# Patient Record
Sex: Male | Born: 1965 | Race: White | Hispanic: No | Marital: Married | Smoking: Never smoker
Health system: Southern US, Community
[De-identification: ages and names within clinical notes are randomized; demographics above are authoritative.]

## PROBLEM LIST (undated history)

## (undated) DIAGNOSIS — I10 Essential (primary) hypertension: Secondary | ICD-10-CM

## (undated) DIAGNOSIS — E7800 Pure hypercholesterolemia, unspecified: Secondary | ICD-10-CM

## (undated) HISTORY — PX: HERNIA REPAIR: SHX51

---

## 2009-03-13 ENCOUNTER — Encounter (INDEPENDENT_AMBULATORY_CARE_PROVIDER_SITE_OTHER): Payer: Self-pay | Admitting: *Deleted

## 2009-03-13 ENCOUNTER — Emergency Department (HOSPITAL_COMMUNITY): Admission: EM | Admit: 2009-03-13 | Discharge: 2009-03-13 | Payer: Self-pay | Admitting: Emergency Medicine

## 2009-03-23 ENCOUNTER — Ambulatory Visit: Payer: Self-pay | Admitting: Gastroenterology

## 2009-03-23 DIAGNOSIS — K625 Hemorrhage of anus and rectum: Secondary | ICD-10-CM

## 2009-03-23 DIAGNOSIS — I10 Essential (primary) hypertension: Secondary | ICD-10-CM

## 2009-03-24 ENCOUNTER — Telehealth: Payer: Self-pay | Admitting: Gastroenterology

## 2009-04-06 ENCOUNTER — Telehealth: Payer: Self-pay | Admitting: Gastroenterology

## 2009-04-12 ENCOUNTER — Ambulatory Visit: Payer: Self-pay | Admitting: Gastroenterology

## 2011-01-31 LAB — COMPREHENSIVE METABOLIC PANEL
ALT: 24 U/L (ref 0–53)
Alkaline Phosphatase: 26 U/L — ABNORMAL LOW (ref 39–117)
BUN: 11 mg/dL (ref 6–23)
Chloride: 108 mEq/L (ref 96–112)
Glucose, Bld: 97 mg/dL (ref 70–99)
Potassium: 3.9 mEq/L (ref 3.5–5.1)
Sodium: 141 mEq/L (ref 135–145)
Total Bilirubin: 0.8 mg/dL (ref 0.3–1.2)

## 2011-01-31 LAB — DIFFERENTIAL
Basophils Absolute: 0 10*3/uL (ref 0.0–0.1)
Basophils Relative: 1 % (ref 0–1)
Eosinophils Absolute: 0.2 10*3/uL (ref 0.0–0.7)
Monocytes Absolute: 0.3 10*3/uL (ref 0.1–1.0)
Neutro Abs: 2.6 10*3/uL (ref 1.7–7.7)
Neutrophils Relative %: 50 % (ref 43–77)

## 2011-01-31 LAB — CBC
HCT: 42.9 % (ref 39.0–52.0)
Hemoglobin: 14.8 g/dL (ref 13.0–17.0)
RBC: 4.78 MIL/uL (ref 4.22–5.81)
WBC: 5 10*3/uL (ref 4.0–10.5)

## 2011-03-10 ENCOUNTER — Emergency Department (HOSPITAL_COMMUNITY)
Admission: EM | Admit: 2011-03-10 | Discharge: 2011-03-10 | Disposition: A | Discharge status: Home / Self Care | Payer: 59 | Attending: Emergency Medicine | Admitting: Emergency Medicine

## 2011-03-10 ENCOUNTER — Emergency Department (HOSPITAL_COMMUNITY): Payer: 59

## 2011-03-10 DIAGNOSIS — Y9269 Other specified industrial and construction area as the place of occurrence of the external cause: Secondary | ICD-10-CM | POA: Insufficient documentation

## 2011-03-10 DIAGNOSIS — W11XXXA Fall on and from ladder, initial encounter: Secondary | ICD-10-CM | POA: Insufficient documentation

## 2011-03-10 DIAGNOSIS — Y990 Civilian activity done for income or pay: Secondary | ICD-10-CM | POA: Insufficient documentation

## 2011-03-10 DIAGNOSIS — M25569 Pain in unspecified knee: Secondary | ICD-10-CM | POA: Insufficient documentation

## 2011-03-10 DIAGNOSIS — I10 Essential (primary) hypertension: Secondary | ICD-10-CM | POA: Insufficient documentation

## 2011-03-10 DIAGNOSIS — IMO0002 Reserved for concepts with insufficient information to code with codable children: Secondary | ICD-10-CM | POA: Insufficient documentation

## 2011-03-10 DIAGNOSIS — E7800 Pure hypercholesterolemia, unspecified: Secondary | ICD-10-CM | POA: Insufficient documentation

## 2017-04-14 ENCOUNTER — Observation Stay (HOSPITAL_BASED_OUTPATIENT_CLINIC_OR_DEPARTMENT_OTHER)
Admission: EM | Admit: 2017-04-14 | Discharge: 2017-04-16 | Disposition: A | Discharge status: Home / Self Care | Payer: 59 | Attending: Internal Medicine | Admitting: Internal Medicine

## 2017-04-14 ENCOUNTER — Encounter (HOSPITAL_BASED_OUTPATIENT_CLINIC_OR_DEPARTMENT_OTHER): Payer: Self-pay | Admitting: Emergency Medicine

## 2017-04-14 ENCOUNTER — Observation Stay (HOSPITAL_COMMUNITY): Payer: 59

## 2017-04-14 DIAGNOSIS — I499 Cardiac arrhythmia, unspecified: Secondary | ICD-10-CM

## 2017-04-14 DIAGNOSIS — R0789 Other chest pain: Secondary | ICD-10-CM

## 2017-04-14 DIAGNOSIS — I4891 Unspecified atrial fibrillation: Secondary | ICD-10-CM | POA: Diagnosis not present

## 2017-04-14 DIAGNOSIS — E782 Mixed hyperlipidemia: Secondary | ICD-10-CM | POA: Insufficient documentation

## 2017-04-14 DIAGNOSIS — E785 Hyperlipidemia, unspecified: Secondary | ICD-10-CM | POA: Diagnosis present

## 2017-04-14 DIAGNOSIS — I493 Ventricular premature depolarization: Secondary | ICD-10-CM | POA: Diagnosis not present

## 2017-04-14 DIAGNOSIS — Z882 Allergy status to sulfonamides status: Secondary | ICD-10-CM | POA: Insufficient documentation

## 2017-04-14 DIAGNOSIS — R002 Palpitations: Secondary | ICD-10-CM | POA: Diagnosis present

## 2017-04-14 DIAGNOSIS — I498 Other specified cardiac arrhythmias: Secondary | ICD-10-CM | POA: Diagnosis present

## 2017-04-14 DIAGNOSIS — I10 Essential (primary) hypertension: Secondary | ICD-10-CM | POA: Diagnosis present

## 2017-04-14 DIAGNOSIS — Z79899 Other long term (current) drug therapy: Secondary | ICD-10-CM | POA: Insufficient documentation

## 2017-04-14 HISTORY — DX: Pure hypercholesterolemia, unspecified: E78.00

## 2017-04-14 HISTORY — DX: Essential (primary) hypertension: I10

## 2017-04-14 LAB — COMPREHENSIVE METABOLIC PANEL
ALT: 29 U/L (ref 17–63)
AST: 21 U/L (ref 15–41)
Albumin: 4.9 g/dL (ref 3.5–5.0)
Alkaline Phosphatase: 36 U/L — ABNORMAL LOW (ref 38–126)
Anion gap: 10 (ref 5–15)
BUN: 18 mg/dL (ref 6–20)
CHLORIDE: 106 mmol/L (ref 101–111)
CO2: 24 mmol/L (ref 22–32)
CREATININE: 1.11 mg/dL (ref 0.61–1.24)
Calcium: 9.9 mg/dL (ref 8.9–10.3)
Glucose, Bld: 114 mg/dL — ABNORMAL HIGH (ref 65–99)
POTASSIUM: 4 mmol/L (ref 3.5–5.1)
Sodium: 140 mmol/L (ref 135–145)
Total Bilirubin: 0.7 mg/dL (ref 0.3–1.2)
Total Protein: 8.4 g/dL — ABNORMAL HIGH (ref 6.5–8.1)

## 2017-04-14 LAB — CBC WITH DIFFERENTIAL/PLATELET
BASOS PCT: 1 %
Basophils Absolute: 0 10*3/uL (ref 0.0–0.1)
Eosinophils Absolute: 0.1 10*3/uL (ref 0.0–0.7)
Eosinophils Relative: 1 %
HCT: 49 % (ref 39.0–52.0)
Hemoglobin: 17.3 g/dL — ABNORMAL HIGH (ref 13.0–17.0)
Lymphocytes Relative: 34 %
Lymphs Abs: 1.8 10*3/uL (ref 0.7–4.0)
MCH: 30.6 pg (ref 26.0–34.0)
MCHC: 35.3 g/dL (ref 30.0–36.0)
MCV: 86.7 fL (ref 78.0–100.0)
MONO ABS: 0.5 10*3/uL (ref 0.1–1.0)
MONOS PCT: 9 %
NEUTROS PCT: 55 %
Neutro Abs: 2.8 10*3/uL (ref 1.7–7.7)
Platelets: 201 10*3/uL (ref 150–400)
RBC: 5.65 MIL/uL (ref 4.22–5.81)
RDW: 13.8 % (ref 11.5–15.5)
WBC: 5.1 10*3/uL (ref 4.0–10.5)

## 2017-04-14 LAB — TROPONIN I: Troponin I: <0.03 ng/mL (ref <0.03)

## 2017-04-14 LAB — MRSA PCR SCREENING: MRSA by PCR: NEGATIVE

## 2017-04-14 LAB — MAGNESIUM: MAGNESIUM: 1.9 mg/dL (ref 1.7–2.4)

## 2017-04-14 LAB — TSH
TSH: 1.245 u[IU]/mL (ref 0.350–4.500)
TSH: 2.516 u[IU]/mL (ref 0.350–4.500)

## 2017-04-14 MED ORDER — ASPIRIN 81 MG PO CHEW
324.0000 mg | CHEWABLE_TABLET | Freq: Once | ORAL | Status: AC
  Administered 2017-04-14: 324 mg via ORAL
  Filled 2017-04-14: qty 4

## 2017-04-14 MED ORDER — IPRATROPIUM-ALBUTEROL 0.5-2.5 (3) MG/3ML IN SOLN: 3.0000 mL | RESPIRATORY_TRACT | Status: DC | PRN

## 2017-04-14 MED ORDER — SODIUM CHLORIDE 0.9 % IV BOLUS (SEPSIS)
1000.0000 mL | Freq: Once | INTRAVENOUS | Status: AC
  Administered 2017-04-14: 1000 mL via INTRAVENOUS

## 2017-04-14 MED ORDER — ASPIRIN EC 81 MG PO TBEC
81.0000 mg | DELAYED_RELEASE_TABLET | Freq: Every day | ORAL | Status: DC
  Administered 2017-04-15 – 2017-04-16 (×2): 81 mg via ORAL
  Filled 2017-04-14 (×2): qty 1

## 2017-04-14 MED ORDER — ONDANSETRON HCL 4 MG PO TABS: 4.0000 mg | ORAL_TABLET | Freq: Four times a day (QID) | ORAL | Status: DC | PRN

## 2017-04-14 MED ORDER — ENOXAPARIN SODIUM 40 MG/0.4ML ~~LOC~~ SOLN
40.0000 mg | SUBCUTANEOUS | Status: DC
  Administered 2017-04-14 – 2017-04-15 (×2): 40 mg via SUBCUTANEOUS
  Filled 2017-04-14 (×2): qty 0.4

## 2017-04-14 MED ORDER — HYDRALAZINE HCL 20 MG/ML IJ SOLN: 10.0000 mg | INTRAMUSCULAR | Status: DC | PRN

## 2017-04-14 MED ORDER — DILTIAZEM HCL 30 MG PO TABS
30.0000 mg | ORAL_TABLET | Freq: Once | ORAL | Status: AC
  Administered 2017-04-14: 30 mg via ORAL
  Filled 2017-04-14: qty 1

## 2017-04-14 MED ORDER — ONDANSETRON HCL 4 MG/2ML IJ SOLN
4.0000 mg | Freq: Four times a day (QID) | INTRAMUSCULAR | Status: DC | PRN
  Administered 2017-04-15: 4 mg via INTRAVENOUS
  Filled 2017-04-14: qty 2

## 2017-04-14 MED ORDER — DILTIAZEM LOAD VIA INFUSION
15.0000 mg | Freq: Once | INTRAVENOUS | Status: AC
  Administered 2017-04-14: 15 mg via INTRAVENOUS
  Filled 2017-04-14: qty 15

## 2017-04-14 MED ORDER — ACETAMINOPHEN 325 MG PO TABS: 650.0000 mg | ORAL_TABLET | Freq: Four times a day (QID) | ORAL | Status: DC | PRN

## 2017-04-14 MED ORDER — ASPIRIN EC 81 MG PO TBEC: 81.0000 mg | DELAYED_RELEASE_TABLET | Freq: Every day | ORAL | Status: DC

## 2017-04-14 MED ORDER — DILTIAZEM HCL 100 MG IV SOLR
5.0000 mg/h | INTRAVENOUS | Status: DC
  Administered 2017-04-14 (×2): 10 mg/h via INTRAVENOUS
  Filled 2017-04-14 (×2): qty 100

## 2017-04-14 NOTE — H&P (Signed)
History and Physical    Theodore Frazier ZOX:[id number] DOB: Mar 08, 1966 DOA: 04/14/2017  Referring MD/NP/PA: Dr. Gershon Crane Ripudeep PCP: Patient, No Pcp Per  Patient coming from:  [facility] transfer  Chief Complaint: Heart fluttering  HPI: Theodore Frazier is a 51 y.o. male with medical history significant of HTN , HLD, and heart murmur; who presents with complaints of acute onset of feeling his heart fluttering today. Last night the patient reports that he drank 12-15  12 ounce beers last night with a friend. He normally does not do this and drinks 1-2 beers during the week. When he woke up this morning he felt empty stomach. He ate something and started to feel slightly better, but palpitations reoccurred. Patient notes that after feeling slightly lightheaded, but denies loss of consciousness. Associated symptoms included left arm numbness for the last 4 days, approximately 12lb weight loss with diet changes, and discomfort with lying down take goes away with standing. He has known that he has an irregular heartbeat/heart murmur for many years, but never diagnosed with atrial fibrillation. Patient has a made a similar episode in the past but not this severe. Denies any shortness of breath, nausea, vomiting, chest pain, change of vision, focal weakness, or leg swellings. He was previously on lisinopril 10 mg for his hypertension, but this was discontinued as his blood pressures have been doing well up until here recently. At home systolic blood pressures have been running in the 140s over the last week. Lastly patient notes that he was told that he had elevated cholesterol levels, but his PCP left and it was never followed back up on.  ED Course: On admission into [facility] patient was seen to be afebrile, pulse up to 151, respirations up to 21, blood pressure elevated to 170/103, and O2 saturation maintained on room air. Initial labs were relatively unremarkable. Patient was started on a diltiazem drip with improvement of heart  rate, and admitted to a stepdown bed.  Review of Systems: As per HPI otherwise 10 point review of systems negative.   Past Medical History:  Diagnosis Date  . High cholesterol   . Hypertension     Past Surgical History:  Procedure Laterality Date  . HERNIA REPAIR       reports that he has never smoked. He has never used smokeless tobacco. He reports that he drinks alcohol. He reports that he does not use drugs.  Allergies  Allergen Reactions  . Sulfonamide Derivatives     History reviewed. No pertinent family history.  Prior to Admission medications   Not on File    Physical Exam:  Constitutional:Middle-aged male in NAD, calm, comfortable Vitals:   04/14/17 1930 04/14/17 1945 04/14/17 2056 04/14/17 2058  BP: 125/84  (!) 170/103 (!) 170/103  Pulse: (!) 40 75 73   Resp: 18 15    Temp:   98.5 F (36.9 C) 98.5 F (36.9 C)  TempSrc:   Oral Oral  SpO2: 95% 97% 98% 99%  Weight:   91.3 kg (201 lb 4.8 oz)   Height:   5\' 9"  (1.753 m)    Eyes: PERRL, lids and conjunctivae normal ENMT: Mucous membranes are moist. Posterior pharynx clear of any exudate or lesions.Normal dentition.  Neck: normal, supple, no masses, no thyromegaly Respiratory: Crackles appreciated in the mid to lower lung fields. Normal respiratory effort. No accessory muscle use.  Cardiovascular: Irregular irregular, possible murmur appreciated, no rubs / gallops. No extremity edema. 2+ pedal pulses. No carotid bruits.  Abdomen: no tenderness,  no masses palpated. No hepatosplenomegaly. Bowel sounds positive.  Musculoskeletal: no clubbing / cyanosis. No joint deformity upper and lower extremities. Good ROM, no contractures. Normal muscle tone.  Skin: no rashes, lesions, ulcers. No induration Neurologic: CN 2-12 grossly intact. Sensation intact, DTR normal. Strength 5/5 in all 4.  Psychiatric: Normal judgment and insight. Alert and oriented x 3. Normal mood.     Labs on Admission: I have personally reviewed  following labs and imaging studies  CBC:  Recent Labs Lab 04/14/17 1351  WBC 5.1  NEUTROABS 2.8  HGB 17.3*  HCT 49.0  MCV 86.7  PLT 201   Basic Metabolic Panel:  Recent Labs Lab 04/14/17 1351  NA 140  K 4.0  CL 106  CO2 24  GLUCOSE 114*  BUN 18  CREATININE 1.11  CALCIUM 9.9   GFR: Estimated Creatinine Clearance: 89.8 mL/min (by C-G formula based on SCr of 1.11 mg/dL). Liver Function Tests:  Recent Labs Lab 04/14/17 1351  AST 21  ALT 29  ALKPHOS 36*  BILITOT 0.7  PROT 8.4*  ALBUMIN 4.9   No results for input(s): LIPASE, AMYLASE in the last 168 hours. No results for input(s): AMMONIA in the last 168 hours. Coagulation Profile: No results for input(s): INR, PROTIME in the last 168 hours. Cardiac Enzymes:  Recent Labs Lab 04/14/17 1351  TROPONINI <0.03   BNP (last 3 results) No results for input(s): PROBNP in the last 8760 hours. HbA1C: No results for input(s): HGBA1C in the last 72 hours. CBG: No results for input(s): GLUCAP in the last 168 hours. Lipid Profile: No results for input(s): CHOL, HDL, LDLCALC, TRIG, CHOLHDL, LDLDIRECT in the last 72 hours. Thyroid Function Tests: No results for input(s): TSH, T4TOTAL, FREET4, T3FREE, THYROIDAB in the last 72 hours. Anemia Panel: No results for input(s): VITAMINB12, FOLATE, FERRITIN, TIBC, IRON, RETICCTPCT in the last 72 hours. Urine analysis: No results found for: COLORURINE, APPEARANCEUR, LABSPEC, PHURINE, GLUCOSEU, HGBUR, BILIRUBINUR, KETONESUR, PROTEINUR, UROBILINOGEN, NITRITE, LEUKOCYTESUR Sepsis Labs: No results found for this or any previous visit (from the past 240 hour(s)).   Radiological Exams on Admission: No results found.  EKG: Independently reviewed. A. fib with RVR at 132 beats per minute  Assessment/Plan Atrial fibrillation with RVR (HCC) : Acute. Patient presents for fluttering found to be in A. fib with RVR. chadsvasc=1 - Admit to stepdown - Atrial fibrillation orders are  initiated - Check TSH and magnesium - Monitor electrolytes and correct as needed - Aspirin - Check echocardiogram - May warrant formal consult to cardiology in a.m.  Essential hypertension: Patient with previous history of hypertension on lisinopril 10 mg, but had been able to be taken off of BP medications. During hospitalization patient's blood pressures noted to be a size 170/103 - Hydralazine IV prn - Patient may likely need to be placed back on lisinopril  Left arm numbness - Trend cardiac enzymes  Hyperlipidemia - Check lipid panel in a.m.    Alcohol use: Question if excessive alcohol use possibly lead to acute onset of symptoms  DVT prophylaxis: Lovenox Code Status: full Family Communication: Discuss plan of care with patient family present at bedside  Disposition Plan: Discharge home possibly in a.m.  Consults called: none Admission status: Observation  Clydie Braunondell A Smith MD [facility] Pager [phone]- [phone]  If 7PM-7AM, please contact night-coverage [email or web address] Password [email or web address]  04/14/2017, 9:08 PM

## 2017-04-14 NOTE — ED Triage Notes (Addendum)
Intermittent palpitations since this morning. Pt reports drinking more alcohol last night than normal.

## 2017-04-14 NOTE — Plan of Care (Signed)
51yo male with hx of Afib, HTN, was drinking alcohol last night, today having palpitations.  EKG with rapid Afib. HR in 130's   Started on cardizem drip at med ctr.   Will need step down bed     Jenavi Beedle M.D. [facility] 04/14/2017, 4:11 PM  Pager: [phone]

## 2017-04-14 NOTE — ED Provider Notes (Signed)
[facility] Provider Note   CSN: [id number] Arrival date & time: 04/14/17  1336     History   Chief Complaint Chief Complaint  Patient presents with  . Palpitations    HPI Tanuj Pricilla Holmucker is a 51 y.o. male.  HPI   Last night drank about 12 beers.  Woke up feeling like stomach empty. Ate and drank some gatorade but then palpitations started. Had pepsi today.  Palpitations starting 1PM, then felt lightheaded. Reports for years has had intermittent palpitations. 5 years ago had palpitations.  Irregular heart beat for 17 years, reports providers would listen to his heart and note irregular beat but he never had an ECG.  He had palpitations similar to today 5 yrs ago but reports he has always been told he has an irregular heart beat. No shortness of breath, no nausea/vomiting, no chest pain. Recently taken off blood pressure medications.     Past Medical History:  Diagnosis Date  . High cholesterol   . Hypertension     Patient Active Problem List   Diagnosis Date Noted  . Atrial fibrillation with RVR (HCC) 04/14/2017  . Atrial fibrillation (HCC) 04/14/2017  . ESSENTIAL HYPERTENSION, BENIGN 03/23/2009  . HEMORRHAGE OF RECTUM AND ANUS 03/23/2009    Past Surgical History:  Procedure Laterality Date  . HERNIA REPAIR         Home Medications    Prior to Admission medications   Not on File    Family History No family history on file.  Social History Social History  Substance Use Topics  . Smoking status: Never Smoker  . Smokeless tobacco: Never Used  . Alcohol use Yes     Comment: occ     Allergies   Sulfonamide derivatives   Review of Systems Review of Systems  Constitutional: Negative for fever.  HENT: Negative for sore throat.   Eyes: Negative for visual disturbance.  Respiratory: Negative for shortness of breath.   Cardiovascular: Positive for palpitations. Negative for chest pain and leg swelling.  Gastrointestinal: Negative for abdominal  pain.  Genitourinary: Negative for difficulty urinating.  Musculoskeletal: Negative for back pain and neck stiffness.  Skin: Negative for rash.  Neurological: Positive for light-headedness. Negative for syncope and headaches.     Physical Exam Updated Vital Signs BP (!) 148/78   Pulse 79   Temp 98.1 F (36.7 C) (Oral)   Resp 18   Ht 5\' 9"  (1.753 m)   Wt 95.3 kg (210 lb)   SpO2 97%   BMI 31.01 kg/m   Physical Exam  Constitutional: He is oriented to person, place, and time. He appears well-developed and well-nourished. No distress.  HENT:  Head: Normocephalic and atraumatic.  Eyes: Conjunctivae and EOM are normal.  Neck: Normal range of motion.  Cardiovascular: Normal heart sounds and intact distal pulses.  An irregularly irregular rhythm present. Tachycardia present.  Exam reveals no gallop and no friction rub.   No murmur heard. Pulmonary/Chest: Effort normal and breath sounds normal. No respiratory distress. He has no wheezes. He has no rales.  Abdominal: Soft. He exhibits no distension. There is no tenderness. There is no guarding.  Musculoskeletal: He exhibits no edema.  Neurological: He is alert and oriented to person, place, and time.  Skin: Skin is warm and dry. He is not diaphoretic.  Nursing note and vitals reviewed.    ED Treatments / Results  Labs (all labs ordered are listed, but only abnormal results are displayed) Labs Reviewed  CBC WITH  DIFFERENTIAL/PLATELET - Abnormal; Notable for the following:       Result Value   Hemoglobin 17.3 (*)    All other components within normal limits  COMPREHENSIVE METABOLIC PANEL - Abnormal; Notable for the following:    Glucose, Bld 114 (*)    Total Protein 8.4 (*)    Alkaline Phosphatase 36 (*)    All other components within normal limits  TROPONIN I  TSH    EKG  EKG Interpretation  Date/Time:  Saturday April 14 2017 13:47:26 EDT Ventricular Rate:  132 PR Interval:    QRS Duration: 94 QT Interval:  316 QTC  Calculation: 468 R Axis:   51 Text Interpretation:  Atrial fibrillation with rapid ventricular response Septal infarct , age undetermined Marked ST abnormality, possible inferior subendocardial injury Abnormal ECG No previous ECGs available Confirmed by Alvira Monday ([id number]) on 04/14/2017 1:56:56 PM       Radiology No results found.  Procedures Procedures (including critical care time)  Medications Ordered in ED Medications  diltiazem (CARDIZEM) 1 mg/mL load via infusion 15 mg (15 mg Intravenous Bolus from Bag 04/14/17 1459)    And  diltiazem (CARDIZEM) 100 mg in dextrose 5 % 100 mL (1 mg/mL) infusion (5 mg/hr Intravenous Rate/Dose Change 04/14/17 1500)  sodium chloride 0.9 % bolus 1,000 mL (0 mLs Intravenous Stopped 04/14/17 1510)  diltiazem (CARDIZEM) tablet 30 mg (30 mg Oral Given 04/14/17 1451)   CRITICAL CARE:afib RVR  Performed by: Rhae Lerner   Total critical care time: 30 minutes  Critical care time was exclusive of separately billable procedures and treating other patients.  Critical care was necessary to treat or prevent imminent or life-threatening deterioration.  Critical care was time spent personally by me on the following activities: development of treatment plan with patient and/or surrogate as well as nursing, discussions with consultants, evaluation of patient's response to treatment, examination of patient, obtaining history from patient or surrogate, ordering and performing treatments and interventions, ordering and review of laboratory studies, ordering and review of radiographic studies, pulse oximetry and re-evaluation of patient's condition.   Initial Impression / Assessment and Plan / ED Course  I have reviewed the triage vital signs and the nursing notes.  Pertinent labs & imaging results that were available during my care of the patient were reviewed by me and considered in my medical decision making (see chart for details).       51 year old male with a history of hypertension hyperlipidemia presents with concern for palpitations. Reports the palpitations and today, however he has a history of "irregular heartbeat" that has been noted by other providers without a diagnosis. His EKG shows atrial fibrillation with RVR with a rate in the 140s. He is otherwise hemodynamically stable, denies chest pain or shortness of breath.  He was given a dose of oral diltiazem, given bolus and started on a drip, with continuing atrial fibrillation with a rate in the 120s with adult drip on 5.  We will titrate drip, admit [facility] hospitalist service at [facility] for further care. Suspect holiday heart given history of alcohol use last night. Patient was given normal saline for rehydration. However, given his hx of irregular heart beat I do not feel he is a candidate for cardioversion.  ChadsVasc 1.  Pt updated of plan of care.  Final Clinical Impressions(s) / ED Diagnoses   Final diagnoses:  Atrial fibrillation with rapid ventricular response (HCC)    New Prescriptions New Prescriptions   No medications on file  Alvira Monday, MD 04/14/17 [phone]

## 2017-04-14 NOTE — ED Notes (Signed)
ED Provider at bedside. 

## 2017-04-15 ENCOUNTER — Observation Stay (HOSPITAL_BASED_OUTPATIENT_CLINIC_OR_DEPARTMENT_OTHER): Payer: 59

## 2017-04-15 DIAGNOSIS — I10 Essential (primary) hypertension: Secondary | ICD-10-CM | POA: Diagnosis not present

## 2017-04-15 DIAGNOSIS — F320 Major depressive disorder, single episode, mild: Secondary | ICD-10-CM | POA: Diagnosis not present

## 2017-04-15 DIAGNOSIS — I4891 Unspecified atrial fibrillation: Secondary | ICD-10-CM

## 2017-04-15 DIAGNOSIS — I493 Ventricular premature depolarization: Secondary | ICD-10-CM | POA: Diagnosis not present

## 2017-04-15 DIAGNOSIS — E782 Mixed hyperlipidemia: Secondary | ICD-10-CM | POA: Diagnosis not present

## 2017-04-15 DIAGNOSIS — E784 Other hyperlipidemia: Secondary | ICD-10-CM | POA: Diagnosis not present

## 2017-04-15 DIAGNOSIS — E785 Hyperlipidemia, unspecified: Secondary | ICD-10-CM | POA: Diagnosis present

## 2017-04-15 LAB — BASIC METABOLIC PANEL
Anion gap: 7 (ref 5–15)
BUN: 14 mg/dL (ref 6–20)
CO2: 25 mmol/L (ref 22–32)
CREATININE: 1.04 mg/dL (ref 0.61–1.24)
Calcium: 9.3 mg/dL (ref 8.9–10.3)
Chloride: 108 mmol/L (ref 101–111)
GFR calc Af Amer: >60 mL/min (ref >60)
GFR calc non Af Amer: >60 mL/min (ref >60)
GLUCOSE: 115 mg/dL — AB (ref 65–99)
POTASSIUM: 3.9 mmol/L (ref 3.5–5.1)
Sodium: 140 mmol/L (ref 135–145)

## 2017-04-15 LAB — LIPID PANEL
CHOLESTEROL: 276 mg/dL — AB (ref 0–200)
HDL: 35 mg/dL — ABNORMAL LOW (ref >40)
LDL Cholesterol: 176 mg/dL — ABNORMAL HIGH (ref 0–99)
Total CHOL/HDL Ratio: 7.9 RATIO
Triglycerides: 323 mg/dL — ABNORMAL HIGH (ref <150)
VLDL: 65 mg/dL — ABNORMAL HIGH (ref 0–40)

## 2017-04-15 LAB — CBC
HCT: 46.1 % (ref 39.0–52.0)
Hemoglobin: 15.7 g/dL (ref 13.0–17.0)
MCH: 30.1 pg (ref 26.0–34.0)
MCHC: 34.1 g/dL (ref 30.0–36.0)
MCV: 88.3 fL (ref 78.0–100.0)
PLATELETS: 198 10*3/uL (ref 150–400)
RBC: 5.22 MIL/uL (ref 4.22–5.81)
RDW: 13.6 % (ref 11.5–15.5)
WBC: 6.3 10*3/uL (ref 4.0–10.5)

## 2017-04-15 LAB — ECHOCARDIOGRAM COMPLETE
AVLVOTPG: 3 mmHg
EERAT: 4.58
FS: 25 % — AB (ref 28–44)
Height: 69 in
IVS/LV PW RATIO, ED: 1.03
LA diam index: 2.08 cm/m2
LA vol A4C: 41 ml
LA vol: 44.1 mL
LASIZE: 43 mm
LAVOLIN: 21.3 mL/m2
LEFT ATRIUM END SYS DIAM: 43 mm
LV PW d: 11.4 mm — AB (ref 0.6–1.1)
LV TDI E'MEDIAL: 8.05
LV e' LATERAL: 10.2 cm/s
LVEEAVG: 4.58
LVEEMED: 4.58
LVOT VTI: 16 cm
LVOT area: 4.52 cm2
LVOT peak vel: 90.7 cm/s
LVOTD: 24 mm
LVOTSV: 72 mL
Lateral S' vel: 16.1 cm/s
MV pk E vel: 46.7 m/s
MVPKAVEL: 65.6 m/s
TAPSE: 21.2 mm
TDI e' lateral: 10.2
Weight: 3224.01 oz

## 2017-04-15 LAB — TROPONIN I
Troponin I: <0.03 ng/mL (ref <0.03)
Troponin I: <0.03 ng/mL (ref <0.03)

## 2017-04-15 LAB — HIV ANTIBODY (ROUTINE TESTING W REFLEX): HIV Screen 4th Generation wRfx: NONREACTIVE

## 2017-04-15 MED ORDER — ATORVASTATIN CALCIUM 20 MG PO TABS
20.0000 mg | ORAL_TABLET | Freq: Every day | ORAL | Status: DC
  Administered 2017-04-15: 20 mg via ORAL
  Filled 2017-04-15: qty 1

## 2017-04-15 MED ORDER — DILTIAZEM HCL 30 MG PO TABS
30.0000 mg | ORAL_TABLET | Freq: Four times a day (QID) | ORAL | Status: DC
  Administered 2017-04-15 – 2017-04-16 (×6): 30 mg via ORAL
  Filled 2017-04-15 (×6): qty 1

## 2017-04-15 MED ORDER — LISINOPRIL 10 MG PO TABS
10.0000 mg | ORAL_TABLET | Freq: Every day | ORAL | Status: DC
  Administered 2017-04-15 – 2017-04-16 (×2): 10 mg via ORAL
  Filled 2017-04-15 (×2): qty 1

## 2017-04-15 MED ORDER — OFF THE BEAT BOOK
Freq: Once | Status: AC
  Administered 2017-04-15: 07:00:00
  Filled 2017-04-15: qty 1

## 2017-04-15 NOTE — Progress Notes (Signed)
  Echocardiogram 2D Echocardiogram has been performed.  Theodore Frazier, Theodore Frazier 04/15/2017, 4:01 PM

## 2017-04-15 NOTE — Progress Notes (Signed)
PROGRESS NOTE    Theodore Frazier  RUE:[id number] DOB: 06-Jun-1966 DOA: 04/14/2017 PCP: Patient, No Pcp Per   Brief Narrative:  51 y.o. WM PMHx HTN , HLD, and heart murmur; abdominal hernia S/P hernia repair   Presents with complaints of acute onset of feeling his heart fluttering today. Last night the patient reports that he drank 12-15  12 ounce beers last night with a friend. He normally does not do this and drinks 1-2 beers during the week. When he woke up this morning he felt empty stomach. He ate something and started to feel slightly better, but palpitations reoccurred. Patient notes that after feeling slightly lightheaded, but denies loss of consciousness. Associated symptoms included left arm numbness for the last 4 days, approximately 12lb weight loss with diet changes, and discomfort with lying down take goes away with standing. He has known that he has an irregular heartbeat/heart murmur for many years, but never diagnosed with atrial fibrillation. Patient has a made a similar episode in the past but not this severe. Denies any shortness of breath, nausea, vomiting, chest pain, change of vision, focal weakness, or leg swellings. He was previously on lisinopril 10 mg for his hypertension, but this was discontinued as his blood pressures have been doing well up until here recently. At home systolic blood pressures have been running in the 140s over the last week. Lastly patient notes that he was told that he had elevated cholesterol levels, but his PCP left and it was never followed back up on.   Subjective: 6/24  A/O 4, negative CP, negative SOB, negative N/V, negative abdominal pain. Patient states this is the first time he has had A. fib.   Assessment & Plan:   Principal Problem:   Atrial fibrillation with RVR (HCC) Active Problems:   Essential hypertension, benign   HLD (hyperlipidemia)   Atrial fibrillation with RVR (HCC) :  -Most likely secondary to large amount alcohol consumed  previous night. -Currently in NSR -Acute. Patient presents for fluttering found to be in A. fib with RVR. chadsvasc=1 - TSH= 2.5 WNL - Echocardiogram pending. If abnormal echocardiogram formal consult to cardiology. -Not on anticoagulation for A. fib. Hesitant to start patient unless he has another episode of A. fib.  Essential hypertension:  -Cardizem 30 mg QID -Restart home lisinopril 10 mg daily (previous history of hypertension on lisinopril 10 mg, but had been able to be taken off of BP medications. During hospitalization patient's blood pressures noted to be a size 170/103) -Hydralazine  PRN  Left arm numbness - Trend cardiac enzymes Recent Labs Lab 04/14/17 1351 04/14/17 2127 04/15/17 0215 04/15/17 0814  TROPONINI <0.03 <0.03 <0.03 <0.03  -Resolved  Hyperlipidemia - Lipid panel not within AHA guideline -Start Lipitor 20 mg daily  Depression -Situational. Recent abdominal hernia repair, per patient no visitors to check on him post surgery made him feel depressed which is why he drank.  -Discuss starting SSRI with patient   Alcohol use: Question if excessive alcohol use possibly lead to acute onset of symptoms     DVT prophylaxis: Lovenox Code Status: Full Family Communication: Family present Disposition Plan: Discharge in next 24-48 hours, ensure patient remains in NSR   Consultants:  None  Procedures/Significant Events:  None   VENTILATOR SETTINGS: None   Cultures 6/23 MRSA by PCR negative  Antimicrobials: Anti-infectives    None       Devices   LINES / TUBES:      Continuous Infusions: . diltiazem (CARDIZEM) infusion  Stopped (04/15/17 0315)     Objective: Vitals:   04/15/17 0627 04/15/17 0735 04/15/17 1200 04/15/17 1215  BP: (!) 145/78 121/77 (!) 143/90 (!) 143/90  Pulse:  82 84 84  Resp:  12 20 17   Temp:  98.2 F (36.8 C)  98.3 F (36.8 C)  TempSrc:  Oral  Oral  SpO2:  96% 96% 98%  Weight:      Height:         Intake/Output Summary (Last 24 hours) at 04/15/17 1418 Last data filed at 04/15/17 1300  Gross per 24 hour  Intake             1080 ml  Output                0 ml  Net             1080 ml   Filed Weights   04/14/17 1345 04/14/17 2056 04/15/17 0500  Weight: 210 lb (95.3 kg) 201 lb 4.8 oz (91.3 kg) 201 lb 8 oz (91.4 kg)    Examination:  General: A/O 4, No acute respiratory distress Eyes: negative scleral hemorrhage, negative anisocoria, negative icterus ENT: Negative Runny nose, negative gingival bleeding, Neck:  Negative scars, masses, torticollis, lymphadenopathy, JVD Lungs: Clear to auscultation bilaterally without wheezes or crackles Cardiovascular: Regular rate and rhythm without murmur gallop or rub normal S1 and S2 Abdomen: negative abdominal pain, nondistended, positive soft, bowel sounds, no rebound, no ascites, no appreciable mass Extremities: No significant cyanosis, clubbing, or edema bilateral lower extremities Skin: Negative rashes, lesions, ulcers Psychiatric:  Positive mild depression Central nervous system:  Cranial nerves II through XII intact, tongue/uvula midline, all extremities muscle strength 5/5, sensation intact throughout, negative dysarthria, negative expressive aphasia, negative receptive aphasia.  .     Data Reviewed: Care during the described time interval was provided by me .  I have reviewed this patient's available data, including medical history, events of note, physical examination, and all test results as part of my evaluation. I have personally reviewed and interpreted all radiology studies.  CBC:  Recent Labs Lab 04/14/17 1351 04/15/17 0215  WBC 5.1 6.3  NEUTROABS 2.8  --   HGB 17.3* 15.7  HCT 49.0 46.1  MCV 86.7 88.3  PLT 201 198   Basic Metabolic Panel:  Recent Labs Lab 04/14/17 1351 04/14/17 2127 04/15/17 0215  NA 140  --  140  K 4.0  --  3.9  CL 106  --  108  CO2 24  --  25  GLUCOSE 114*  --  115*  BUN 18  --  14   CREATININE 1.11  --  1.04  CALCIUM 9.9  --  9.3  MG  --  1.9  --    GFR: Estimated Creatinine Clearance: 96 mL/min (by C-G formula based on SCr of 1.04 mg/dL). Liver Function Tests:  Recent Labs Lab 04/14/17 1351  AST 21  ALT 29  ALKPHOS 36*  BILITOT 0.7  PROT 8.4*  ALBUMIN 4.9   No results for input(s): LIPASE, AMYLASE in the last 168 hours. No results for input(s): AMMONIA in the last 168 hours. Coagulation Profile: No results for input(s): INR, PROTIME in the last 168 hours. Cardiac Enzymes:  Recent Labs Lab 04/14/17 1351 04/14/17 2127 04/15/17 0215 04/15/17 0814  TROPONINI <0.03 <0.03 <0.03 <0.03   BNP (last 3 results) No results for input(s): PROBNP in the last 8760 hours. HbA1C: No results for input(s): HGBA1C in the last 72 hours. CBG:  No results for input(s): GLUCAP in the last 168 hours. Lipid Profile:  Recent Labs  04/15/17 0215  CHOL 276*  HDL 35*  LDLCALC 176*  TRIG 323*  CHOLHDL 7.9   Thyroid Function Tests:  Recent Labs  04/14/17 2127  TSH 2.516   Anemia Panel: No results for input(s): VITAMINB12, FOLATE, FERRITIN, TIBC, IRON, RETICCTPCT in the last 72 hours. Urine analysis: No results found for: COLORURINE, APPEARANCEUR, LABSPEC, PHURINE, GLUCOSEU, HGBUR, BILIRUBINUR, KETONESUR, PROTEINUR, UROBILINOGEN, NITRITE, LEUKOCYTESUR Sepsis Labs: @LABRCNTIP (procalcitonin:4,lacticidven:4)  ) Recent Results (from the past 240 hour(s))  MRSA PCR Screening     Status: None   Collection Time: 04/14/17  9:07 PM  Result Value Ref Range Status   MRSA by PCR NEGATIVE NEGATIVE Final    Comment:        The GeneXpert MRSA Assay (FDA approved for NASAL specimens only), is one component of a comprehensive MRSA colonization surveillance program. It is not intended to diagnose MRSA infection nor to guide or monitor treatment for MRSA infections.          Radiology Studies: Dg Chest Port 1 View  Result Date: 04/15/2017 CLINICAL DATA:   Chest tightness EXAM: PORTABLE CHEST 1 VIEW COMPARISON:  None available FINDINGS: The heart size and mediastinal contours are within normal limits. Both lungs are clear. The visualized skeletal structures are unremarkable. IMPRESSION: No active disease. Electronically Signed   By: Judie PetitM.  Shick M.D.   On: 04/15/2017 09:04        Scheduled Meds: . aspirin EC  81 mg Oral Daily  . diltiazem  30 mg Oral Q6H  . enoxaparin (LOVENOX) injection  40 mg Subcutaneous Q24H   Continuous Infusions: . diltiazem (CARDIZEM) infusion Stopped (04/15/17 0315)     LOS: 0 days    Time spent: 40 minutes    Zander Ingham, Roselind MessierURTIS J, MD [facility] Pager [phone]   If 7PM-7AM, please contact night-coverage [email or web address] Password [email or web address] 04/15/2017, 2:18 PM

## 2017-04-16 DIAGNOSIS — R0789 Other chest pain: Secondary | ICD-10-CM | POA: Diagnosis not present

## 2017-04-16 DIAGNOSIS — E784 Other hyperlipidemia: Secondary | ICD-10-CM | POA: Diagnosis not present

## 2017-04-16 DIAGNOSIS — I4891 Unspecified atrial fibrillation: Secondary | ICD-10-CM

## 2017-04-16 DIAGNOSIS — I10 Essential (primary) hypertension: Secondary | ICD-10-CM

## 2017-04-16 DIAGNOSIS — I498 Other specified cardiac arrhythmias: Secondary | ICD-10-CM | POA: Diagnosis present

## 2017-04-16 DIAGNOSIS — I499 Cardiac arrhythmia, unspecified: Secondary | ICD-10-CM

## 2017-04-16 LAB — POTASSIUM: POTASSIUM: 3.8 mmol/L (ref 3.5–5.1)

## 2017-04-16 LAB — MAGNESIUM: Magnesium: 2.1 mg/dL (ref 1.7–2.4)

## 2017-04-16 MED ORDER — DILTIAZEM HCL ER COATED BEADS 120 MG PO CP24
120.0000 mg | ORAL_CAPSULE | Freq: Every day | ORAL | Status: DC
  Administered 2017-04-16: 120 mg via ORAL
  Filled 2017-04-16: qty 1

## 2017-04-16 MED ORDER — LISINOPRIL 10 MG PO TABS: 10.0000 mg | ORAL_TABLET | Freq: Every day | ORAL | 0 refills | Status: DC

## 2017-04-16 MED ORDER — ASPIRIN 81 MG PO TBEC: 81.0000 mg | DELAYED_RELEASE_TABLET | Freq: Every day | ORAL | 0 refills | Status: DC

## 2017-04-16 MED ORDER — ATORVASTATIN CALCIUM 10 MG PO TABS: 10.0000 mg | ORAL_TABLET | Freq: Every day | ORAL | 0 refills | Status: DC

## 2017-04-16 MED ORDER — DILTIAZEM HCL ER COATED BEADS 120 MG PO CP24: 120.0000 mg | ORAL_CAPSULE | Freq: Every day | ORAL | 0 refills | Status: DC

## 2017-04-16 NOTE — Discharge Instructions (Addendum)
Atrial Fibrillation Atrial fibrillation is a type of heartbeat that is irregular or fast (rapid). If you have this condition, your heart keeps quivering in a weird (chaotic) way. This condition can make it so your heart cannot pump blood normally. Having this condition gives a person more risk for stroke, heart failure, and other heart problems. There are different types of atrial fibrillation. Talk with your doctor to learn about the type that you have. Follow these instructions at home:  Take over-the-counter and prescription medicines only as told by your doctor.  If your doctor prescribed a blood-thinning medicine, take it exactly as told. Taking too much of it can cause bleeding. If you do not take enough of it, you will not have the protection that you need against stroke and other problems.  Do not use any tobacco products. These include cigarettes, chewing tobacco, and e-cigarettes. If you need help quitting, ask your doctor.  If you have apnea (obstructive sleep apnea), manage it as told by your doctor.  Do not drink alcohol.  Do not drink beverages that have caffeine. These include coffee, soda, and tea.  Maintain a healthy weight. Do not use diet pills unless your doctor says they are safe for you. Diet pills may make heart problems worse.  Follow diet instructions as told by your doctor.  Exercise regularly as told by your doctor.  Keep all follow-up visits as told by your doctor. This is important. Contact a doctor if:  You notice a change in the speed, rhythm, or strength of your heartbeat.  You are taking a blood-thinning medicine and you notice more bruising.  You get tired more easily when you move or exercise. Get help right away if:  You have pain in your chest or your belly (abdomen).  You have sweating or weakness.  You feel sick to your stomach (nauseous).  You notice blood in your throw up (vomit), poop (stool), or pee (urine).  You are short of  breath.  You suddenly have swollen feet and ankles.  You feel dizzy.  Your suddenly get weak or numb in your face, arms, or legs, especially if it happens on one side of your body.  You have trouble talking, trouble understanding, or both.  Your face or your eyelid droops on one side. These symptoms may be an emergency. Do not wait to see if the symptoms will go away. Get medical help right away. Call your local emergency services (911 in the [place] Do not drive yourself to the hospital. This information is not intended to replace advice given to you by your health care provider. Make sure you discuss any questions you have with your health care provider. Document Released: 07/18/2008 Document Revised: 03/16/2016 Document Reviewed: 02/03/2015 Elsevier Interactive Patient Education  2018 [employer]   Food Choices to Lower Your Triglycerides Triglycerides are a type of fat in your blood. High levels of triglycerides can increase the risk of heart disease and stroke. If your triglyceride levels are high, the foods you eat and your eating habits are very important. Choosing the right foods can help lower your triglycerides. What general guidelines do I need to follow?  Lose weight if you are overweight.  Limit or avoid alcohol.  Fill one half of your plate with vegetables and green salads.  Limit fruit to two servings a day. Choose fruit instead of juice.  Make one fourth of your plate whole grains. Look for the word "whole" as the first word in the ingredient  list.  Fill one fourth of your plate with lean protein foods.  Enjoy fatty fish (such as salmon, mackerel, sardines, and tuna) three times a week.  Choose healthy fats.  Limit foods high in starch and sugar.  Eat more home-cooked food and less restaurant, buffet, and fast food.  Limit fried foods.  Cook foods using methods other than frying.  Limit saturated fats.  Check ingredient lists to avoid foods with  partially hydrogenated oils (trans fats) in them. What foods can I eat? Grains Whole grains, such as whole wheat or whole grain breads, crackers, cereals, and pasta. Unsweetened oatmeal, bulgur, barley, quinoa, or brown rice. Corn or whole wheat flour tortillas. Vegetables Fresh or frozen vegetables (raw, steamed, roasted, or grilled). Green salads. Fruits All fresh, canned (in natural juice), or frozen fruits. Meat and Other Protein Products Ground beef (85% or leaner), grass-fed beef, or beef trimmed of fat. Skinless chicken or [place]. Ground chicken or [place]. Pork trimmed of fat. All fish and seafood. Eggs. Dried beans, peas, or lentils. Unsalted nuts or seeds. Unsalted canned or dry beans. Dairy Low-fat dairy products, such as skim or 1% milk, 2% or reduced-fat cheeses, low-fat ricotta or cottage cheese, or plain low-fat yogurt. Fats and Oils Tub margarines without trans fats. Light or reduced-fat mayonnaise and salad dressings. Avocado. Safflower, olive, or canola oils. Natural peanut or almond butter. The items listed above may not be a complete list of recommended foods or beverages. Contact your dietitian for more options. What foods are not recommended? Grains White bread. White pasta. White rice. Cornbread. Bagels, pastries, and croissants. Crackers that contain trans fat. Vegetables White potatoes. Corn. Creamed or fried vegetables. Vegetables in a cheese sauce. Fruits Dried fruits. Canned fruit in light or heavy syrup. Fruit juice. Meat and Other Protein Products Fatty cuts of meat. Ribs, chicken wings, bacon, sausage, bologna, salami, chitterlings, fatback, hot dogs, bratwurst, and packaged luncheon meats. Dairy Whole or 2% milk, cream, half-and-half, and cream cheese. Whole-fat or sweetened yogurt. Full-fat cheeses. Nondairy creamers and whipped toppings. Processed cheese, cheese spreads, or cheese curds. Sweets and Desserts Corn syrup, sugars, honey, and molasses. Candy.  Jam and jelly. Syrup. Sweetened cereals. Cookies, pies, cakes, donuts, muffins, and ice cream. Fats and Oils Butter, stick margarine, lard, shortening, ghee, or bacon fat. Coconut, palm kernel, or palm oils. Beverages Alcohol. Sweetened drinks (such as sodas, lemonade, and fruit drinks or punches). The items listed above may not be a complete list of foods and beverages to avoid. Contact your dietitian for more information. This information is not intended to replace advice given to you by your health care provider. Make sure you discuss any questions you have with your health care provider. Document Released: 07/27/2004 Document Revised: 03/16/2016 Document Reviewed: 08/13/2013 Elsevier Interactive Patient Education  2017 [employer].

## 2017-04-16 NOTE — Discharge Summary (Addendum)
Physician Discharge Summary  Casimer BilisMurl Swatzell WUJ:[id number] DOB: 1965/12/14 DOA: 04/14/2017  [facility]   Admit date: 04/14/2017 Discharge date: 04/16/2017  Admitted From: Home Disposition:  Home  Recommendations for Outpatient Follow-up:  1. Follow up with PCP in 1-2 weeks  Home Health: None Equipment/Devices: None  Discharge Condition: Fair CODE STATUS: Full code Diet recommendation: 2 g sodium    Discharge Diagnoses:  Principal Problem:   Atrial fibrillation with RVR (HCC)   Active Problems:   Essential hypertension, benign   Mixed HLD (hyperlipidemia)  Brief narrative/history of present illness 51 year old male with hypertension, hyperlipidemia, heart murmur presented with acute onset of heart flutter. The previous night he drank about 12-15, 12 ounce beer. Patient reported that he had been drinking for last 8 years. In the morning he started having palpitations became lightheaded without any loss of consciousness. Patient has been trying to lose weight with diet regimen and has lost almost 12 pounds in the past 2 months. He feels his heartbeat to be irregular and presented to the ED. At home his systolic blood pressure was running in the 140s in the past week. He was previously on lisinopril but was discontinued as his blood pressure was doing well until recently. He is also aware that his cholesterol were elevated but has not been on any medication since he was transitioning to a new PCP in the practice.  In the ED he was in rapid A. fib with heart rate in 150s, elevated blood pressure 170/103 mmHg. Remaining vitals were stable. He was started on Cardizem drip and placed on stepdown unit.  Hospital course Atrial fibrillation with RVR Likely secondary to uncontrolled hypertension and heavy alcohol use the previous night. Heart rate resolved to sinus on Cardizem drip and was transitioned to oral Cardizem 30 mg every 6 hours. Patient noted to have  frequent PVCs and ventricular bigeminy on the monitor. Now asymptomatic and transitioned to long-acting oral Cardizem 120 mg daily. Lisinopril resumed and blood pressure is stable. 2-D echo shows low normal EF of 50-55% with grade 1 diastolic dysfunction. Trivial MR noted. His CHADS2vasc is only 1. Patient started on baby aspirin as well.  Patient instructed on diet and medication adherence and establish care at his PCP office.  Essential hypertension Resumed lisinopril. Added Cardizem. Counseled on diet and exercise. Encouraged on working to lose weight.  Alcohol use Patient had quit for several years and drank heavily recently. Counseled on quitting.  PVCs and bigeminy Normal in size. Asymptomatic.  Dyslipidemia High as high LDL and triglycerides. Patient started on low-dose Lipitor. Follow-up as outpatient.  Consults: None  Family communication: Wife at bedside  Procedure: 2-D echo  Disposition: Home  Discharge Instructions   Allergies as of 04/16/2017      Reactions   Hydrochlorothiazide Other (See Comments)   Sharp pain in chest, sweaty and clammy   Sulfa Antibiotics Other (See Comments)   Increased blood pressure, sweaty and clammy      Medication List    STOP taking these medications   GOODY HEADACHE PO     TAKE these medications   aspirin 81 MG EC tablet Take 1 tablet (81 mg total) by mouth daily.   atorvastatin 10 MG tablet Commonly known as:  LIPITOR Take 1 tablet (10 mg total) by mouth daily at 6 PM.   diltiazem 120 MG 24 hr capsule Commonly known as:  CARDIZEM CD Take 1 capsule (120 mg total) by mouth daily.   fluticasone  50 MCG/ACT nasal spray Commonly known as:  FLONASE Place 2 sprays into both nostrils daily as needed (congestion).   lisinopril 10 MG tablet Commonly known as:  PRINIVIL,ZESTRIL Take 1 tablet (10 mg total) by mouth daily.      Follow-up Information    [facility]. Schedule an appointment as soon as possible  for a visit in 1 week(s).          Allergies  Allergen Reactions  . Hydrochlorothiazide Other (See Comments)    Sharp pain in chest, sweaty and clammy  . Sulfa Antibiotics Other (See Comments)    Increased blood pressure, sweaty and clammy        Procedures/Studies: Dg Chest Port 1 View  Result Date: 04/15/2017 CLINICAL DATA:  Chest tightness EXAM: PORTABLE CHEST 1 VIEW COMPARISON:  None available FINDINGS: The heart size and mediastinal contours are within normal limits. Both lungs are clear. The visualized skeletal structures are unremarkable. IMPRESSION: No active disease. Electronically Signed   By: Judie Petit.  Shick M.D.   On: 04/15/2017 09:04    2-D echo Study Conclusions  - Left ventricle: The cavity size was normal. Wall thickness was   normal. Systolic function was low normal to mildly reduced. The   estimated ejection fraction was in the range of 50% to 55%. Wall   motion was normal; there were no regional wall motion   abnormalities. Doppler parameters are consistent with abnormal   left ventricular relaxation (grade 1 diastolic dysfunction). - Aortic valve: There was no stenosis. - Aorta: Mildly dilated aortic root. Aortic root dimension: 38 mm   (ED). - Mitral valve: There was trivial regurgitation. - Right ventricle: The cavity size was normal. Systolic function   was normal. - Pulmonary arteries: No complete TR doppler jet so unable to   estimate PA systolic pressure. - Inferior vena cava: The vessel was normal in size. The   respirophasic diameter changes were in the normal range (>= 50%),   consistent with normal central venous pressure.  Impressions:  - Technically difficult study with poor acoustic windows. Difficult   to quantify EF given frequent PVCs. Suspect low normal to mildly   reduced systolic function, EF 50-55%. Normal RV size and systolic   function. No significant valvular abnormalities.  Subjective: Noted for frequent PVCs on the monitor.  Denies further palpitations, chest pain or . Denies chest pain for shortness of breath.   Discharge Exam: Vitals:   04/16/17 0552 04/16/17 0640  BP: 110/74 105/70  Pulse: 77 72  Resp: 18   Temp: 97.6 F (36.4 C)    Vitals:   04/16/17 0020 04/16/17 0102 04/16/17 0552 04/16/17 0640  BP: 120/73 101/71 110/74 105/70  Pulse: (!) 59 80 77 72  Resp:   18   Temp:   97.6 F (36.4 C)   TempSrc:   Oral   SpO2:   96%   Weight:   91.4 kg (201 lb 8 oz)   Height:        General: Middle aged male in no acute distress HEENT: Moist mucosa, supple neck Chest: Clear bilaterally So this: Normal S1 and S2, no murmurs GI: Soft, nondistended, nontender Musculoskeletal: Warm, no edema    The results of significant diagnostics from this hospitalization (including imaging, microbiology, ancillary and laboratory) are listed below for reference.     Microbiology: Recent Results (from the past 240 hour(s))  MRSA PCR Screening     Status: None   Collection Time: 04/14/17  9:07  PM  Result Value Ref Range Status   MRSA by PCR NEGATIVE NEGATIVE Final    Comment:        The GeneXpert MRSA Assay (FDA approved for NASAL specimens only), is one component of a comprehensive MRSA colonization surveillance program. It is not intended to diagnose MRSA infection nor to guide or monitor treatment for MRSA infections.      Labs: BNP (last 3 results) No results for input(s): BNP in the last 8760 hours. Basic Metabolic Panel:  Recent Labs Lab 04/14/17 1351 04/14/17 2127 04/15/17 0215 04/16/17 0813  NA 140  --  140  --   K 4.0  --  3.9 3.8  CL 106  --  108  --   CO2 24  --  25  --   GLUCOSE 114*  --  115*  --   BUN 18  --  14  --   CREATININE 1.11  --  1.04  --   CALCIUM 9.9  --  9.3  --   MG  --  1.9  --  2.1   Liver Function Tests:  Recent Labs Lab 04/14/17 1351  AST 21  ALT 29  ALKPHOS 36*  BILITOT 0.7  PROT 8.4*  ALBUMIN 4.9   No results for input(s): LIPASE, AMYLASE in  the last 168 hours. No results for input(s): AMMONIA in the last 168 hours. CBC:  Recent Labs Lab 04/14/17 1351 04/15/17 0215  WBC 5.1 6.3  NEUTROABS 2.8  --   HGB 17.3* 15.7  HCT 49.0 46.1  MCV 86.7 88.3  PLT 201 198   Cardiac Enzymes:  Recent Labs Lab 04/14/17 1351 04/14/17 2127 04/15/17 0215 04/15/17 0814  TROPONINI <0.03 <0.03 <0.03 <0.03   BNP: Invalid input(s): POCBNP CBG: No results for input(s): GLUCAP in the last 168 hours. D-Dimer No results for input(s): DDIMER in the last 72 hours. Hgb A1c No results for input(s): HGBA1C in the last 72 hours. Lipid Profile  Recent Labs  04/15/17 0215  CHOL 276*  HDL 35*  LDLCALC 176*  TRIG 323*  CHOLHDL 7.9   Thyroid function studies  Recent Labs  04/14/17 2127  TSH 2.516   Anemia work up No results for input(s): VITAMINB12, FOLATE, FERRITIN, TIBC, IRON, RETICCTPCT in the last 72 hours. Urinalysis No results found for: COLORURINE, APPEARANCEUR, LABSPEC, PHURINE, GLUCOSEU, HGBUR, BILIRUBINUR, KETONESUR, PROTEINUR, UROBILINOGEN, NITRITE, LEUKOCYTESUR Sepsis Labs Invalid input(s): PROCALCITONIN,  WBC,  LACTICIDVEN Microbiology Recent Results (from the past 240 hour(s))  MRSA PCR Screening     Status: None   Collection Time: 04/14/17  9:07 PM  Result Value Ref Range Status   MRSA by PCR NEGATIVE NEGATIVE Final    Comment:        The GeneXpert MRSA Assay (FDA approved for NASAL specimens only), is one component of a comprehensive MRSA colonization surveillance program. It is not intended to diagnose MRSA infection nor to guide or monitor treatment for MRSA infections.      Time coordinating discharge: <30 minutes  SIGNED:   Eddie North, MD  [facility] 04/16/2017, 9:24 AM Pager   If 7PM-7AM, please contact night-coverage [email or web address] Password [email or web address]

## 2021-04-07 ENCOUNTER — Other Ambulatory Visit: Payer: Self-pay

## 2021-04-07 ENCOUNTER — Encounter (HOSPITAL_BASED_OUTPATIENT_CLINIC_OR_DEPARTMENT_OTHER): Payer: Self-pay | Admitting: *Deleted

## 2021-04-07 ENCOUNTER — Emergency Department (HOSPITAL_BASED_OUTPATIENT_CLINIC_OR_DEPARTMENT_OTHER)
Admission: EM | Admit: 2021-04-07 | Discharge: 2021-04-07 | Disposition: A | Discharge status: Home / Self Care | Payer: 59 | Attending: Emergency Medicine | Admitting: Emergency Medicine

## 2021-04-07 DIAGNOSIS — Z79899 Other long term (current) drug therapy: Secondary | ICD-10-CM | POA: Diagnosis not present

## 2021-04-07 DIAGNOSIS — I10 Essential (primary) hypertension: Secondary | ICD-10-CM | POA: Insufficient documentation

## 2021-04-07 DIAGNOSIS — M25561 Pain in right knee: Secondary | ICD-10-CM | POA: Insufficient documentation

## 2021-04-07 MED ORDER — IBUPROFEN 800 MG PO TABS: 800.0000 mg | ORAL_TABLET | Freq: Three times a day (TID) | ORAL | 0 refills | Status: DC | PRN

## 2021-04-07 MED ORDER — OXYCODONE-ACETAMINOPHEN 5-325 MG PO TABS: 1.0000 | ORAL_TABLET | Freq: Four times a day (QID) | ORAL | 0 refills | Status: DC | PRN

## 2021-04-07 NOTE — ED Triage Notes (Signed)
Right knee pain for about a week, has been taking ibuprofen without relief. Denies injury.

## 2021-04-07 NOTE — ED Provider Notes (Signed)
[facility] Provider Note: Lowella Dell, MD, FACEP  CSN: [id number] MRN: [id number] ARRIVAL: 04/07/21 at 0215 ROOM: [facility]05/[facility]   CHIEF COMPLAINT  Knee Pain   HISTORY OF PRESENT ILLNESS  04/07/21 2:31 AM Theodore Frazier is a 55 y.o. male who has had pain in his right knee for about the last week.  There was no specific injury or inciting event.  He rates his pain as a 6 out of 10, worse with movement.  He describes the pain as a burning pain deep in the joint and also in the popliteal fossa.  There is no associated swelling, deformity, erythema or warmth.  He has been wearing a knee brace with some relief.  Pain is worse when he gets up in the morning.  He is usually able to improve the pain by walking in the morning, but yesterday this was unsuccessful.  He has been taking 2400 mg of ibuprofen daily without adequate relief.   Past Medical History:  Diagnosis Date   High cholesterol    Hypertension     Past Surgical History:  Procedure Laterality Date   HERNIA REPAIR      No family history on file.  Social History   Tobacco Use   Smoking status: Never   Smokeless tobacco: Never  Substance Use Topics   Alcohol use: Yes    Comment: occ   Drug use: No    Prior to Admission medications   Medication Sig Start Date End Date Taking? Authorizing Provider  ibuprofen (ADVIL) 800 MG tablet Take 1 tablet (800 mg total) by mouth every 8 (eight) hours as needed (for pain). 04/07/21  Yes Ladasia Sircy, MD  oxyCODONE-acetaminophen (PERCOCET) 5-325 MG tablet Take 1 tablet by mouth every 6 (six) hours as needed for severe pain. 04/07/21  Yes Megahn Killings, MD  atorvastatin (LIPITOR) 10 MG tablet Take 1 tablet (10 mg total) by mouth daily at 6 PM. 04/16/17   Dhungel, Nishant, MD  diltiazem (CARDIZEM CD) 120 MG 24 hr capsule Take 1 capsule (120 mg total) by mouth daily. 04/16/17   Dhungel, Nishant, MD  fluticasone (FLONASE) 50 MCG/ACT nasal spray Place 2 sprays into both nostrils daily  as needed (congestion).    [provider]    Allergies Hydrochlorothiazide and Sulfa antibiotics   REVIEW OF SYSTEMS  Negative except as noted here or in the History of Present Illness.   PHYSICAL EXAMINATION  Initial Vital Signs Blood pressure (!) 136/97, pulse 89, temperature 98.2 F (36.8 C), temperature source Oral, resp. rate 16, height 5\' 9"  (1.753 m), weight 81.2 kg, SpO2 99 %.  Examination General: Well-developed, well-nourished male in no acute distress; appearance consistent with age of record HENT: normocephalic; atraumatic Eyes: Normal appearance Neck: supple Heart: regular rate and rhythm Lungs: clear to auscultation bilaterally Abdomen: soft; nondistended; nontender; bowel sounds present Extremities: No deformity; no swelling or effusion of right knee; no erythema or warmth of right knee; right knee stable with pain on passive range of motion Neurologic: Awake, alert and oriented; motor function intact in all extremities and symmetric; no facial droop Skin: Warm and dry Psychiatric: Normal mood and affect   RESULTS  Summary of this visit's results, reviewed and interpreted by myself:   EKG Interpretation  Date/Time:    Ventricular Rate:    PR Interval:    QRS Duration:   QT Interval:    QTC Calculation:   R Axis:     Text Interpretation:  Laboratory Studies: No results found for this or any previous visit (from the past 24 hour(s)). Imaging Studies: No results found.  ED COURSE and MDM  Nursing notes, initial and subsequent vitals signs, including pulse oximetry, reviewed and interpreted by myself.  Vitals:   04/07/21 0228  BP: (!) 136/97  Pulse: 89  Resp: 16  Temp: 98.2 F (36.8 C)  TempSrc: Oral  SpO2: 99%  Weight: 81.2 kg  Height: 5\' 9"  (1.753 m)   Medications - No data to display  Apart from pain the patient's knee exam is normal and I do not believe radiographs would be beneficial at this time.  We will refer  him to Dr. of sports medicine who may be able to perform a cortisone injection.  In the meantime we will treat his pain.  The fact that his pain is worse in the morning suggest osteoarthritis.  I would suspect more effusion, erythema or warmth with an infected knee or gout.   PROCEDURES  Procedures   ED DIAGNOSES     ICD-10-CM   1. Acute pain of right knee  M25.561          Itsel Opfer, Jordan Likes, MD 04/07/21 [phone]

## 2021-09-20 ENCOUNTER — Encounter (HOSPITAL_BASED_OUTPATIENT_CLINIC_OR_DEPARTMENT_OTHER): Payer: Self-pay

## 2021-09-20 ENCOUNTER — Emergency Department (HOSPITAL_BASED_OUTPATIENT_CLINIC_OR_DEPARTMENT_OTHER): Payer: 59

## 2021-09-20 ENCOUNTER — Other Ambulatory Visit: Payer: Self-pay

## 2021-09-20 ENCOUNTER — Emergency Department (HOSPITAL_BASED_OUTPATIENT_CLINIC_OR_DEPARTMENT_OTHER)
Admission: EM | Admit: 2021-09-20 | Discharge: 2021-09-20 | Disposition: A | Discharge status: Home / Self Care | Payer: 59 | Attending: Emergency Medicine | Admitting: Emergency Medicine

## 2021-09-20 DIAGNOSIS — R000 Tachycardia, unspecified: Secondary | ICD-10-CM | POA: Insufficient documentation

## 2021-09-20 DIAGNOSIS — Z20822 Contact with and (suspected) exposure to covid-19: Secondary | ICD-10-CM | POA: Insufficient documentation

## 2021-09-20 DIAGNOSIS — I10 Essential (primary) hypertension: Secondary | ICD-10-CM | POA: Diagnosis not present

## 2021-09-20 DIAGNOSIS — Z79899 Other long term (current) drug therapy: Secondary | ICD-10-CM | POA: Insufficient documentation

## 2021-09-20 DIAGNOSIS — R072 Precordial pain: Secondary | ICD-10-CM | POA: Insufficient documentation

## 2021-09-20 DIAGNOSIS — R079 Chest pain, unspecified: Secondary | ICD-10-CM

## 2021-09-20 LAB — COMPREHENSIVE METABOLIC PANEL
ALT: 18 U/L (ref 0–44)
AST: 21 U/L (ref 15–41)
Albumin: 4.2 g/dL (ref 3.5–5.0)
Alkaline Phosphatase: 32 U/L — ABNORMAL LOW (ref 38–126)
Anion gap: 10 (ref 5–15)
BUN: 14 mg/dL (ref 6–20)
CO2: 23 mmol/L (ref 22–32)
Calcium: 9 mg/dL (ref 8.9–10.3)
Chloride: 104 mmol/L (ref 98–111)
Creatinine, Ser: 1.06 mg/dL (ref 0.61–1.24)
GFR, Estimated: >60 mL/min (ref >60)
Glucose, Bld: 158 mg/dL — ABNORMAL HIGH (ref 70–99)
Potassium: 3.7 mmol/L (ref 3.5–5.1)
Sodium: 137 mmol/L (ref 135–145)
Total Bilirubin: 0.8 mg/dL (ref 0.3–1.2)
Total Protein: 7.1 g/dL (ref 6.5–8.1)

## 2021-09-20 LAB — CBC WITH DIFFERENTIAL/PLATELET
Abs Immature Granulocytes: 0.01 10*3/uL (ref 0.00–0.07)
Basophils Absolute: 0 10*3/uL (ref 0.0–0.1)
Basophils Relative: 1 %
Eosinophils Absolute: 0.1 10*3/uL (ref 0.0–0.5)
Eosinophils Relative: 2 %
HCT: 46.6 % (ref 39.0–52.0)
Hemoglobin: 15.7 g/dL (ref 13.0–17.0)
Immature Granulocytes: 0 %
Lymphocytes Relative: 28 %
Lymphs Abs: 1.2 10*3/uL (ref 0.7–4.0)
MCH: 28.8 pg (ref 26.0–34.0)
MCHC: 33.7 g/dL (ref 30.0–36.0)
MCV: 85.3 fL (ref 80.0–100.0)
Monocytes Absolute: 0.3 10*3/uL (ref 0.1–1.0)
Monocytes Relative: 8 %
Neutro Abs: 2.7 10*3/uL (ref 1.7–7.7)
Neutrophils Relative %: 61 %
Platelets: 180 10*3/uL (ref 150–400)
RBC: 5.46 MIL/uL (ref 4.22–5.81)
RDW: 13.2 % (ref 11.5–15.5)
WBC: 4.4 10*3/uL (ref 4.0–10.5)
nRBC: 0 % (ref 0.0–0.2)

## 2021-09-20 LAB — RESP PANEL BY RT-PCR (FLU A&B, COVID) ARPGX2
Influenza A by PCR: NEGATIVE
Influenza B by PCR: NEGATIVE
SARS Coronavirus 2 by RT PCR: NEGATIVE

## 2021-09-20 LAB — TROPONIN I (HIGH SENSITIVITY)
Troponin I (High Sensitivity): 4 ng/L (ref <18)
Troponin I (High Sensitivity): 4 ng/L (ref <18)

## 2021-09-20 LAB — LIPASE, BLOOD: Lipase: 37 U/L (ref 11–51)

## 2021-09-20 MED ORDER — LIDOCAINE VISCOUS HCL 2 % MT SOLN
15.0000 mL | Freq: Once | OROMUCOSAL | Status: AC
  Administered 2021-09-20: 15 mL via ORAL

## 2021-09-20 MED ORDER — HYDROXYZINE HCL 25 MG PO TABS: 25.0000 mg | ORAL_TABLET | Freq: Three times a day (TID) | ORAL | 0 refills | Status: DC | PRN

## 2021-09-20 MED ORDER — SODIUM CHLORIDE 0.9 % IV BOLUS
1000.0000 mL | Freq: Once | INTRAVENOUS | Status: AC
  Administered 2021-09-20: 1000 mL via INTRAVENOUS

## 2021-09-20 MED ORDER — DILTIAZEM HCL 25 MG/5ML IV SOLN
10.0000 mg | Freq: Once | INTRAVENOUS | Status: AC
  Administered 2021-09-20: 10 mg via INTRAVENOUS

## 2021-09-20 MED ORDER — ALUM & MAG HYDROXIDE-SIMETH 200-200-20 MG/5ML PO SUSP
30.0000 mL | Freq: Once | ORAL | Status: AC
  Administered 2021-09-20: 30 mL via ORAL

## 2021-09-20 NOTE — Discharge Instructions (Signed)
Suspect symptoms are secondary to anxiety and stress.  However recommend that you follow-up with your cardiologist.  Please return to the emergency department if symptoms worsen.

## 2021-09-20 NOTE — ED Provider Notes (Signed)
[facility] EMERGENCY DEPARTMENT Provider Note   CSN: [id number] Arrival date & time: 09/20/21  9323     History Chief Complaint  Patient presents with   Chest Pain    Theodore Frazier is a 55 y.o. male.  The history is provided by the patient.  Chest Pain Pain location:  Substernal area Pain quality: aching   Pain radiates to:  Does not radiate Pain severity:  Mild Onset quality:  Gradual Duration:  1 day Timing:  Intermittent Progression:  Improving Chronicity:  New Context: stress   Relieved by:  Nothing Worsened by:  Nothing Associated symptoms: no abdominal pain, no back pain, no cough, no fever, no palpitations, no shortness of breath and no vomiting   Risk factors: high cholesterol and hypertension   Risk factors: no coronary artery disease, no prior DVT/PE and no smoking       Past Medical History:  Diagnosis Date   High cholesterol    Hypertension     Patient Active Problem List   Diagnosis Date Noted   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  Social History   Tobacco Use   Smoking status: Never   Smokeless tobacco: Never  Substance Use Topics   Alcohol use: Not Currently    Comment: occ   Drug use: No    Home Medications Prior to Admission medications   Medication Sig Start Date End Date Taking? Authorizing Provider  hydrOXYzine (ATARAX) 25 MG tablet Take 1 tablet (25 mg total) by mouth every 8 (eight) hours as needed for up to 20 doses for anxiety. 09/20/21  Yes Taniya Dasher, DO  atorvastatin (LIPITOR) 10 MG tablet Take 1 tablet (10 mg total) by mouth daily at 6 PM. 04/16/17   Dhungel, Nishant, MD   diltiazem (CARDIZEM CD) 120 MG 24 hr capsule Take 1 capsule (120 mg total) by mouth daily. 04/16/17   Dhungel, Nishant, MD  fluticasone (FLONASE) 50 MCG/ACT nasal spray Place 2 sprays into both nostrils daily as needed (congestion).    [provider]  ibuprofen (ADVIL) 800 MG tablet Take 1 tablet (800 mg total) by mouth every 8 (eight) hours as needed (for pain). 04/07/21   Molpus, John, MD  oxyCODONE-acetaminophen (PERCOCET) 5-325 MG tablet Take 1 tablet by mouth every 6 (six) hours as needed for severe pain. 04/07/21   Molpus, John, MD    Allergies    Amoxicillin, Hydrochlorothiazide, and Sulfa antibiotics  Review of Systems   Review of Systems  Constitutional:  Negative for chills and fever.  HENT:  Negative for ear pain and sore throat.   Eyes:  Negative for pain and visual disturbance.  Respiratory:  Negative for cough and shortness of breath.   Cardiovascular:  Positive for chest pain. Negative for palpitations.  Gastrointestinal:  Negative for abdominal pain and vomiting.  Genitourinary:  Negative for dysuria and hematuria.  Musculoskeletal:  Negative for arthralgias and back pain.  Skin:  Negative for color change and rash.  Neurological:  Negative for seizures and syncope.  Psychiatric/Behavioral:  The patient is nervous/anxious.   All other systems reviewed and are negative.  Physical Exam Updated Vital  Signs BP 136/73   Pulse (!) 46   Temp 98.3 F (36.8 C) (Oral)   Resp 14   Ht 5\' 9"  (1.753 m)   Wt 83.9 kg   SpO2 97%   BMI 27.32 kg/m   Physical Exam Vitals and nursing note reviewed.  Constitutional:      General: He is not in acute distress.    Appearance: He is well-developed. He is not ill-appearing.  HENT:     Head: Normocephalic and atraumatic.  Eyes:     Conjunctiva/sclera: Conjunctivae normal.     Pupils: Pupils are equal, round, and reactive to light.  Cardiovascular:     Rate and Rhythm: Tachycardia present. Rhythm irregular.     Pulses:           Radial pulses are 2+ on the right side and 2+ on the left side.     Heart sounds: No murmur heard. Pulmonary:     Effort: Pulmonary effort is normal. No respiratory distress.     Breath sounds: Normal breath sounds.  Abdominal:     Palpations: Abdomen is soft.     Tenderness: There is no abdominal tenderness.  Musculoskeletal:        General: No swelling.     Cervical back: Normal range of motion and neck supple.     Right lower leg: No edema.     Left lower leg: No edema.  Skin:    General: Skin is warm and dry.     Capillary Refill: Capillary refill takes less than 2 seconds.  Neurological:     Mental Status: He is alert.  Psychiatric:        Mood and Affect: Mood normal.    ED Results / Procedures / Treatments   Labs (all labs ordered are listed, but only abnormal results are displayed) Labs Reviewed  COMPREHENSIVE METABOLIC PANEL - Abnormal; Notable for the following components:      Result Value   Glucose, Bld 158 (*)    Alkaline Phosphatase 32 (*)    All other components within normal limits  RESP PANEL BY RT-PCR (FLU A&B, COVID) ARPGX2  CBC WITH DIFFERENTIAL/PLATELET  LIPASE, BLOOD  TROPONIN I (HIGH SENSITIVITY)  TROPONIN I (HIGH SENSITIVITY)    EKG None  Radiology DG Chest Portable 1 View  Result Date: 09/20/2021 CLINICAL DATA:  Chest pain EXAM: PORTABLE CHEST 1 VIEW COMPARISON:  07/28/2020 FINDINGS: 0716 hours. The lungs are clear without focal pneumonia, edema, pneumothorax or pleural effusion. Cardiopericardial silhouette is at upper limits of normal for size. The visualized bony structures of the thorax show no acute abnormality. Telemetry leads overlie the chest. IMPRESSION: No active disease. Electronically Signed   By: Misty Stanley M.D.   On: 09/20/2021 07:51    Procedures Procedures   Medications Ordered in ED Medications  diltiazem (CARDIZEM) injection 10 mg (10 mg Intravenous Given 09/20/21 0828)  sodium chloride 0.9 % bolus 1,000 mL (0  mLs Intravenous Stopped 09/20/21 0956)  alum & mag hydroxide-simeth (MAALOX/MYLANTA) 200-200-20 MG/5ML suspension 30 mL (30 mLs Oral Given 09/20/21 0956)    And  lidocaine (XYLOCAINE) 2 % viscous mouth solution 15 mL (15 mLs Oral Given 09/20/21 [id number])    ED Course  I have reviewed the triage vital signs and the nursing notes.  Pertinent labs & imaging results that were available during my care of the patient were reviewed by me and considered in my medical decision making (see chart for details).    MDM Rules/Calculators/A&P  Jartavious Lanton is here with chest pain and anxiety.  Normal vitals.  No fever.  EKG shows atrial fibrillation but no ischemic changes.  Heart rate mostly in the 90s.  Has a low CHA2DS2-VASc score of 1 and not on anticoagulation at baseline.  He takes diltiazem which she just took prior to arrival.  He was having some chest discomfort overnight.  He states that he is under a lot of stress and anxiety right now as he is separated from his wife this past week.  He has no reproducible chest pain.  He runs frequently and does not have any chest pain when he works out.  Overall atypical story.  Chest x-ray without signs of pneumonia.  No significant anemia, electrolyte , kidney injury.  We will get troponins to evaluate for ACS.  Will give GI cocktail and reevaluate.  No PE risk factors, Wells criteria 0. Heart score 2.  Troponin negative x2.  No significant anemia, electrolyte abnormality, kidney injury.  Chest x-ray negative for infection.  No pneumothorax.  Viral testing negative.  Overall suspect may be reflux versus anxiety.  Seems less likely to be cardiac given history and physical and work-up.  However recommend follow-up with cardiology and understands return precautions.  This chart was dictated using voice recognition software.  Despite best efforts to proofread,  errors can occur which can change the documentation meaning.   Final Clinical  Impression(s) / ED Diagnoses Final diagnoses:  Nonspecific chest pain    Rx / DC Orders ED Discharge Orders          Ordered    hydrOXYzine (ATARAX) 25 MG tablet  Every 8 hours PRN        09/20/21 [place], [place], DO 09/20/21 1112

## 2021-09-20 NOTE — ED Triage Notes (Addendum)
Pt reports chest pain for approximately for one hour last night. States he believes it is anxiety due to his wife leaving him last week. Pt says he took a busipirone last night one hour prior to pain  Pt has not been able to sleep and has tried OTC meds without any effect Feels shaky

## 2022-08-13 IMAGING — DX DG CHEST 1V PORT
1 series · 1 of 1 positions shown · non-contrast
Comparison: 07/28/2020

CLINICAL DATA: Chest pain

EXAM:
PORTABLE CHEST 1 VIEW

[chest ap]
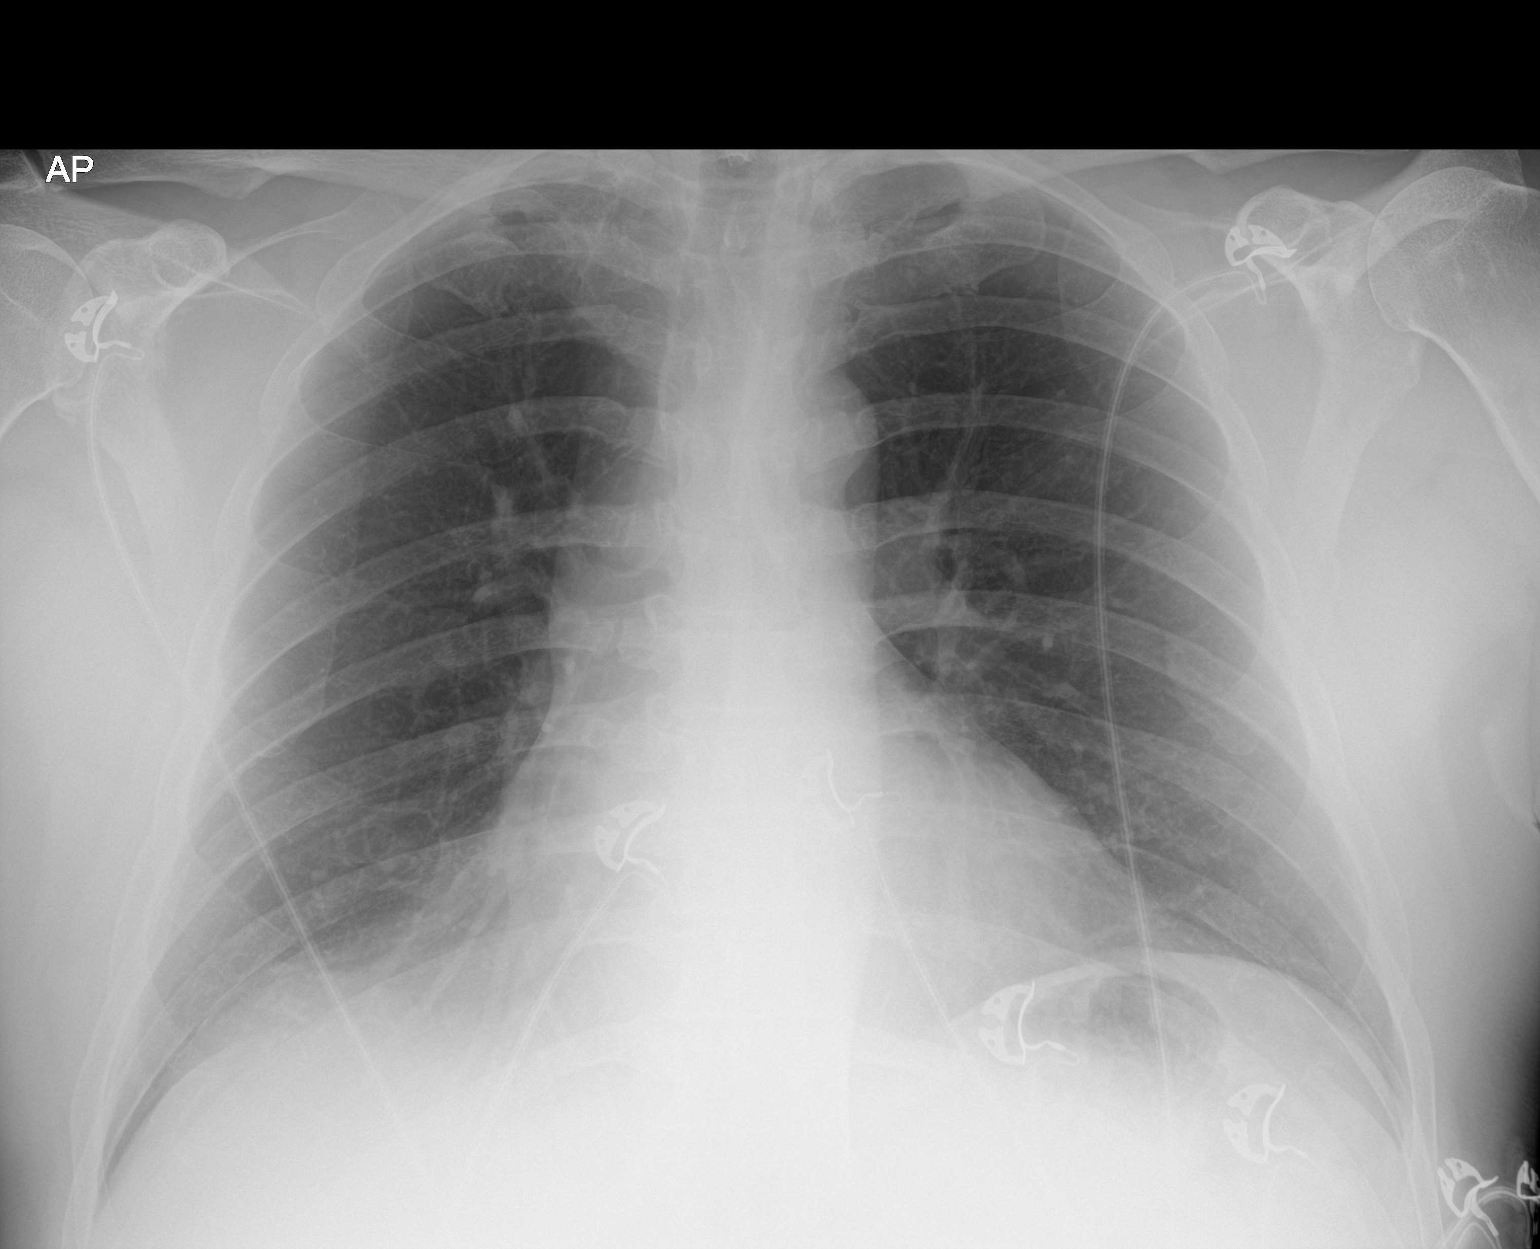

[1 of 1 positions shown; findings below may reference images not displayed]

FINDINGS: 7665 hours. The lungs are clear without focal pneumonia, edema,
pneumothorax or pleural effusion. Cardiopericardial silhouette is at
upper limits of normal for size. The visualized bony structures of
the thorax show no acute abnormality. Telemetry leads overlie the
chest.
IMPRESSION: No active disease.

## 2024-04-21 ENCOUNTER — Emergency Department (HOSPITAL_BASED_OUTPATIENT_CLINIC_OR_DEPARTMENT_OTHER)
Admission: EM | Admit: 2024-04-21 | Discharge: 2024-04-22 | Disposition: A | Discharge status: Home / Self Care | Attending: Emergency Medicine | Admitting: Emergency Medicine

## 2024-04-21 ENCOUNTER — Other Ambulatory Visit: Payer: Self-pay

## 2024-04-21 ENCOUNTER — Encounter (HOSPITAL_BASED_OUTPATIENT_CLINIC_OR_DEPARTMENT_OTHER): Payer: Self-pay | Admitting: Emergency Medicine

## 2024-04-21 ENCOUNTER — Emergency Department (HOSPITAL_BASED_OUTPATIENT_CLINIC_OR_DEPARTMENT_OTHER)

## 2024-04-21 DIAGNOSIS — S0012XA Contusion of left eyelid and periocular area, initial encounter: Secondary | ICD-10-CM | POA: Diagnosis not present

## 2024-04-21 DIAGNOSIS — S0990XA Unspecified injury of head, initial encounter: Secondary | ICD-10-CM | POA: Insufficient documentation

## 2024-04-21 DIAGNOSIS — S0592XA Unspecified injury of left eye and orbit, initial encounter: Secondary | ICD-10-CM | POA: Diagnosis present

## 2024-04-21 DIAGNOSIS — K029 Dental caries, unspecified: Secondary | ICD-10-CM | POA: Diagnosis not present

## 2024-04-21 DIAGNOSIS — S0010XA Contusion of unspecified eyelid and periocular area, initial encounter: Secondary | ICD-10-CM

## 2024-04-21 DIAGNOSIS — X58XXXA Exposure to other specified factors, initial encounter: Secondary | ICD-10-CM | POA: Diagnosis not present

## 2024-04-21 DIAGNOSIS — G08 Intracranial and intraspinal phlebitis and thrombophlebitis: Secondary | ICD-10-CM

## 2024-04-21 DIAGNOSIS — I676 Nonpyogenic thrombosis of intracranial venous system: Secondary | ICD-10-CM | POA: Insufficient documentation

## 2024-04-21 LAB — CBC
HCT: 42.5 % (ref 39.0–52.0)
Hemoglobin: 14 g/dL (ref 13.0–17.0)
MCH: 28.5 pg (ref 26.0–34.0)
MCHC: 32.9 g/dL (ref 30.0–36.0)
MCV: 86.4 fL (ref 80.0–100.0)
Platelets: 162 10*3/uL (ref 150–400)
RBC: 4.92 MIL/uL (ref 4.22–5.81)
RDW: 14 % (ref 11.5–15.5)
WBC: 5 10*3/uL (ref 4.0–10.5)
nRBC: 0 % (ref 0.0–0.2)

## 2024-04-21 LAB — BASIC METABOLIC PANEL WITH GFR
Anion gap: 11 (ref 5–15)
BUN: 18 mg/dL (ref 6–20)
CO2: 22 mmol/L (ref 22–32)
Calcium: 8.9 mg/dL (ref 8.9–10.3)
Chloride: 108 mmol/L (ref 98–111)
Creatinine, Ser: 1.05 mg/dL (ref 0.61–1.24)
GFR, Estimated: >60 mL/min (ref >60)
Glucose, Bld: 96 mg/dL (ref 70–99)
Potassium: 3.9 mmol/L (ref 3.5–5.1)
Sodium: 142 mmol/L (ref 135–145)

## 2024-04-21 MED ORDER — IOHEXOL 350 MG/ML SOLN
75.0000 mL | Freq: Once | INTRAVENOUS | Status: AC | PRN
  Administered 2024-04-21: 75 mL via INTRAVENOUS

## 2024-04-21 NOTE — ED Notes (Signed)
 Patient transported to CT

## 2024-04-21 NOTE — ED Triage Notes (Signed)
 Pt POV steady gait- reports new [place] bruising since yesterday afternoon.  Denies known injury, cause, blood thinners, blurred vision.   Eye feels like it has pressure, bruising is tender to touch.

## 2024-04-21 NOTE — ED Notes (Signed)
 Pt independent to the restroom at this time.  No issues.

## 2024-04-21 NOTE — ED Provider Notes (Incomplete)
 [place] EMERGENCY DEPARTMENT AT [facility] Provider Note   CSN: [id number] Arrival date & time: 04/21/24  8166     Patient presents with: Eye Problem   Theodore Frazier is a 58 y.o. male.  {Add pertinent medical, surgical, social history, OB history to HPI:8075} 58 year old male presenting with black eye.  Patient woke up from a nap yesterday and noticed bruising under his left eye, no known injury or inciting event.  He then noticed some light sensitivity while driving, specifically with LED headlights bothering his left eye.  He works in an environment under [employer] and says he has needed to wear sunglasses indoors because of the light sensitivity.  Denies changes in his vision, headache, nausea/vomiting.  He is not on blood thinners.  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   Eye Problem      Prior to Admission medications   Medication Sig Start Date End Date Taking? Authorizing Provider  atorvastatin  (LIPITOR) 10 MG tablet Take 1 tablet (10 mg total) by mouth daily at 6 PM. 04/16/17   Dhungel, Nishant, MD  diltiazem  (CARDIZEM  CD) 120 MG 24 hr capsule Take 1 capsule (120 mg total) by mouth daily. 04/16/17   Dhungel, Nishant, MD  fluticasone (FLONASE) 50 MCG/ACT nasal spray Place 2 sprays into both nostrils daily as needed (congestion).    [provider]  hydrOXYzine  (ATARAX ) 25 MG tablet Take 1 tablet (25 mg total) by mouth every 8 (eight) hours as needed for up to 20 doses for anxiety. 09/20/21   Curatolo, Adam, DO  ibuprofen  (ADVIL ) 800 MG tablet Take 1 tablet (800 mg total) by mouth every 8 (eight) hours as needed (for pain). 04/07/21   Molpus, John, MD  oxyCODONE -acetaminophen  (PERCOCET) 5-325 MG tablet Take 1 tablet by mouth every 6 (six) hours as needed for severe pain. 04/07/21   Molpus, John, MD    Allergies: Amoxicillin,  Hydrochlorothiazide, and Sulfa antibiotics    Review of Systems  Updated Vital Signs BP (!) 154/100   Pulse 92   Temp 98.1 F (36.7 C)   Resp 15   Ht 5' 9 (1.753 m)   Wt 98.4 kg   SpO2 99%   BMI 32.05 kg/m   Physical Exam Vitals and nursing note reviewed.  HENT:     Head: Normocephalic.     Comments: No Battle's sign  Eyes:     Extraocular Movements: Extraocular movements intact.     Conjunctiva/sclera: Conjunctivae normal.     Comments: Bruising inferior to medial canthus of left eye Mild photophobia left eye   Cardiovascular:     Rate and Rhythm: Normal rate.  Pulmonary:     Effort: Pulmonary effort is normal.   Musculoskeletal:     Cervical back: Normal range of motion and neck supple. No rigidity.     Comments: Moves all extremities spontaneously without difficulty   Skin:    General: Skin is warm and dry.   Neurological:     Mental Status: He is alert and oriented to person, place, and time.     (all labs ordered are listed, but only abnormal results are displayed) Labs Reviewed  CBC  BASIC METABOLIC PANEL WITH GFR    EKG: None  Radiology: CT Head Wo Contrast Result Date: 04/21/2024 CLINICAL DATA:  unexplained infraorbital bruising, light sensitivity EXAM: CT HEAD WITHOUT CONTRAST CT MAXILLOFACIAL WITHOUT CONTRAST TECHNIQUE: Multidetector  CT imaging of the head and maxillofacial structures were performed using the standard protocol without intravenous contrast. Multiplanar CT image reconstructions of the maxillofacial structures were also generated. RADIATION DOSE REDUCTION: This exam was performed according to the departmental dose-optimization program which includes automated exposure control, adjustment of the mA and/or kV according to patient size and/or use of iterative reconstruction technique. COMPARISON:  None Available. FINDINGS: CT HEAD FINDINGS Brain: No intracranial hemorrhage, mass effect, or midline shift. Cavum septum pellucidum, variant  anatomy. No hydrocephalus. The basilar cisterns are patent. No evidence of territorial infarct or acute ischemia. No extra-axial or intracranial fluid collection. Vascular: No hyperdense vessel. There is a questionable hypodensity within the midline aspect of the right transverse sinus., coronal series 6, image 17. Skull: No fracture or focal lesion. Other: Unremarkable scalp soft tissues. CT MAXILLOFACIAL FINDINGS Osseous: Expansile cyst adjacent to the right upper central and lateral incisors in the maxilla with thinning of the cortex. This contains an impacted first bicuspid. Additional periapical lucencies about the right upper molars. Scattered dental caries. No acute fracture. The temporomandibular joints are congruent. Orbits: Negative. No traumatic or inflammatory finding. Sinuses: Mucosal thickening in the right maxillary sinus. No sinus fluid levels. Soft tissues: Left infraorbital soft tissue thickening. No focal fluid collection. IMPRESSION: 1. No acute intracranial abnormality. 2. Questionable hypodensity within the central aspect of the right transverse sinus. This could represent variant anatomy or chronic clot. Consider further assessment with CT venogram or brain MRI with contrast. 3. Left infraorbital soft tissue thickening. No focal fluid collection. 4. Expansile cyst adjacent to the right upper central and lateral incisors in the maxilla with thinning of the cortex. This contains an impacted first bicuspid. Poor dentition with scattered dental caries. Additional periapical lucencies about the right upper molars. Recommend follow-up dental assessment Electronically Signed   By: Andrea Gasman M.D.   On: 04/21/2024 20:43   CT Maxillofacial Wo Contrast Result Date: 04/21/2024 CLINICAL DATA:  unexplained infraorbital bruising, light sensitivity EXAM: CT HEAD WITHOUT CONTRAST CT MAXILLOFACIAL WITHOUT CONTRAST TECHNIQUE: Multidetector CT imaging of the head and maxillofacial structures were  performed using the standard protocol without intravenous contrast. Multiplanar CT image reconstructions of the maxillofacial structures were also generated. RADIATION DOSE REDUCTION: This exam was performed according to the departmental dose-optimization program which includes automated exposure control, adjustment of the mA and/or kV according to patient size and/or use of iterative reconstruction technique. COMPARISON:  None Available. FINDINGS: CT HEAD FINDINGS Brain: No intracranial hemorrhage, mass effect, or midline shift. Cavum septum pellucidum, variant anatomy. No hydrocephalus. The basilar cisterns are patent. No evidence of territorial infarct or acute ischemia. No extra-axial or intracranial fluid collection. Vascular: No hyperdense vessel. There is a questionable hypodensity within the midline aspect of the right transverse sinus., coronal series 6, image 17. Skull: No fracture or focal lesion. Other: Unremarkable scalp soft tissues. CT MAXILLOFACIAL FINDINGS Osseous: Expansile cyst adjacent to the right upper central and lateral incisors in the maxilla with thinning of the cortex. This contains an impacted first bicuspid. Additional periapical lucencies about the right upper molars. Scattered dental caries. No acute fracture. The temporomandibular joints are congruent. Orbits: Negative. No traumatic or inflammatory finding. Sinuses: Mucosal thickening in the right maxillary sinus. No sinus fluid levels. Soft tissues: Left infraorbital soft tissue thickening. No focal fluid collection. IMPRESSION: 1. No acute intracranial abnormality. 2. Questionable hypodensity within the central aspect of the right transverse sinus. This could represent variant anatomy or chronic clot. Consider further assessment with CT  venogram or brain MRI with contrast. 3. Left infraorbital soft tissue thickening. No focal fluid collection. 4. Expansile cyst adjacent to the right upper central and lateral incisors in the maxilla  with thinning of the cortex. This contains an impacted first bicuspid. Poor dentition with scattered dental caries. Additional periapical lucencies about the right upper molars. Recommend follow-up dental assessment Electronically Signed   By: Andrea Gasman M.D.   On: 04/21/2024 20:43    {Document cardiac monitor, telemetry assessment procedure when appropriate:32947} Procedures   Medications Ordered in the ED - No data to display    {Click here for ABCD2, HEART and other calculators REFRESH Note before signing:1}                              Medical Decision Making This patient presents to the ED for concern of bruising, this involves an extensive number of treatment options, and is a complaint that carries with it a high risk of complications and morbidity.  The differential diagnosis includes contusion, intracranial hemorrhage, skull fracture, maxillofacial fracture.   Co morbidities that complicate the patient evaluation  Hyperlipidemia   Additional history obtained:  Additional history obtained from record review, discussion with wife External records from outside source obtained and reviewed including telemedicine note from earlier today   Lab Tests:  I Ordered, and personally interpreted labs.  The pertinent results include: CBC within normal limits, BMP within normal limits.   Imaging Studies ordered:  I ordered imaging studies including CT head/maxillofacial then CT venogram I independently visualized and interpreted imaging which showed  CT head/maxillofacial:  1. No acute intracranial abnormality. 2. Questionable hypodensity within the central aspect of the right transverse sinus. This could represent variant anatomy or chronic clot. Consider further assessment with CT venogram or brain MRI with contrast. 3. Left infraorbital soft tissue thickening. No focal fluid collection. 4. Expansile cyst adjacent to the right upper central and lateral incisors in the  maxilla with thinning of the cortex. This contains an impacted first bicuspid. Poor dentition with scattered dental caries. Additional periapical lucencies about the right upper molars. 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 Remaining dural venous sinuses are patent.    I agree with the radiologist interpretation   Cardiac Monitoring: / EKG:  The patient was maintained on a cardiac monitor.  I personally viewed and interpreted the cardiac monitored which showed an underlying rhythm of: NSR   Problem List / ED Course / Critical interventions / Medication management  *** I ordered medication including ***  for ***  Reevaluation of the patient after these medicines showed that the patient {resolved/improved/worsened:23923::improved} I have reviewed the patients home medicines and have made adjustments as needed   Test / Admission - Considered:  Physical exam largely unremarkable, there is bruising noted inferior to the left eye, patient does not complain of vision changes nor does he demonstrate pain with EOMs, he has no signs of basilar skull fracture including Battle sign.  Patient is unsure how this bruising developed, he denies trauma/inciting event.  Patient's wife is insistent that he receive CT imaging to rule out intracranial hemorrhage, as this was discussed during his telemedicine visit with his primary care provider earlier in the day.  CT head/maxillofacial notable as above.  Based on incidental finding of questionable hypodensity, I  did obtain basic labs including CBC/BMP prior to CT venogram.  See above for CT venogram results.    Amount and/or Complexity of Data Reviewed Labs: ordered. Radiology: ordered.  Risk Prescription drug management.   ***  {Document critical care time when appropriate   Document review of labs and clinical decision tools ie CHADS2VASC2, etc  Document your independent review of radiology images and any outside records  Document your discussion with family members, caretakers and with consultants  Document social determinants of health affecting pt's care  Document your decision making why or why not admission, treatments were needed:32947:::1}   Final diagnoses:  None    ED Discharge Orders     None

## 2024-04-21 NOTE — ED Provider Notes (Signed)
 [place] [facility] Provider Note   CSN: [id number] Arrival date & time: 04/21/24  8166     Patient presents with: Eye Problem   Theodore Frazier is a 58 y.o. male.  {Add pertinent medical, surgical, social history, OB history to HPI:7345} 58 year old male presenting with black eye.  Patient woke up from a nap yesterday and noticed bruising under his left eye, no known injury or inciting event.  He then noticed some light sensitivity while driving, specifically with LED headlights bothering his left eye.  He works in an environment under [employer] and says he has needed to wear sunglasses indoors because of the light sensitivity.  Denies changes in his vision, headache, nausea/vomiting.  He is not on blood thinners.  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   Eye Problem      Prior to Admission medications   Medication Sig Start Date End Date Taking? Authorizing Provider  atorvastatin  (LIPITOR) 10 MG tablet Take 1 tablet (10 mg total) by mouth daily at 6 PM. 04/16/17   Dhungel, Nishant, MD  diltiazem  (CARDIZEM  CD) 120 MG 24 hr capsule Take 1 capsule (120 mg total) by mouth daily. 04/16/17   Dhungel, Nishant, MD  fluticasone (FLONASE) 50 MCG/ACT nasal spray Place 2 sprays into both nostrils daily as needed (congestion).    [provider]  hydrOXYzine  (ATARAX ) 25 MG tablet Take 1 tablet (25 mg total) by mouth every 8 (eight) hours as needed for up to 20 doses for anxiety. 09/20/21   Curatolo, Adam, DO  ibuprofen  (ADVIL ) 800 MG tablet Take 1 tablet (800 mg total) by mouth every 8 (eight) hours as needed (for pain). 04/07/21   Molpus, John, MD  oxyCODONE -acetaminophen  (PERCOCET) 5-325 MG tablet Take 1 tablet by mouth every 6 (six) hours as needed for severe pain. 04/07/21   Molpus, John, MD    Allergies: Amoxicillin,  Hydrochlorothiazide, and Sulfa antibiotics    Review of Systems  Updated Vital Signs BP (!) 154/100   Pulse 92   Temp 98.1 F (36.7 C)   Resp 15   Ht 5' 9 (1.753 m)   Wt 98.4 kg   SpO2 99%   BMI 32.05 kg/m   Physical Exam Vitals and nursing note reviewed.  HENT:     Head: Normocephalic.     Comments: No Battle's sign  Eyes:     Extraocular Movements: Extraocular movements intact.     Conjunctiva/sclera: Conjunctivae normal.     Comments: Bruising inferior to medial canthus of left eye Mild photophobia left eye   Cardiovascular:     Rate and Rhythm: Normal rate.  Pulmonary:     Effort: Pulmonary effort is normal.   Musculoskeletal:     Cervical back: Normal range of motion and neck supple. No rigidity.     Comments: Moves all extremities spontaneously without difficulty   Skin:    General: Skin is warm and dry.   Neurological:     Mental Status: He is alert and oriented to person, place, and time.     (all labs ordered are listed, but only abnormal results are displayed) Labs Reviewed - No data to display  EKG: None  Radiology: No results found.  {Document cardiac monitor, telemetry assessment procedure when appropriate:32947} Procedures   Medications Ordered in the ED - No data to display    {Click here for  ABCD2, HEART and other calculators REFRESH Note before signing:1}                              Medical Decision Making This patient presents to the ED for concern of ***, this involves an extensive number of treatment options, and is a complaint that carries with it a high risk of complications and morbidity.  The differential diagnosis includes ***   Co morbidities that complicate the patient evaluation  ***   Additional history obtained:  Additional history obtained from *** External records from outside source obtained and reviewed including ***   Lab Tests:  I Ordered, and personally interpreted labs.  The pertinent results  include:  ***    Imaging Studies ordered:  I ordered imaging studies including ***  I independently visualized and interpreted imaging which showed *** I agree with the radiologist interpretation   Cardiac Monitoring: / EKG:  The patient was maintained on a cardiac monitor.  I personally viewed and interpreted the cardiac monitored which showed an underlying rhythm of: ***   Consultations Obtained:  I requested consultation with the ***,  and discussed lab and imaging findings as well as pertinent plan - they recommend: ***   Problem List / ED Course / Critical interventions / Medication management  *** I ordered medication including ***  for ***  Reevaluation of the patient after these medicines showed that the patient {resolved/improved/worsened:23923::improved} I have reviewed the patients home medicines and have made adjustments as needed   Social Determinants of Health:  ***   Test / Admission - Considered:  ***    Amount and/or Complexity of Data Reviewed Radiology: ordered.   ***  {Document critical care time when appropriate  Document review of labs and clinical decision tools ie CHADS2VASC2, etc  Document your independent review of radiology images and any outside records  Document your discussion with family members, caretakers and with consultants  Document social determinants of health affecting pt's care  Document your decision making why or why not admission, treatments were needed:32947:::1}   Final diagnoses:  None    ED Discharge Orders     None

## 2024-04-21 NOTE — ED Notes (Signed)
 ED Provider at bedside.

## 2024-04-22 MED ORDER — APIXABAN 5 MG PO TABS: 5.0000 mg | ORAL_TABLET | Freq: Two times a day (BID) | ORAL | 0 refills | Status: AC

## 2024-04-22 MED ORDER — CLINDAMYCIN HCL 150 MG PO CAPS: 150.0000 mg | ORAL_CAPSULE | Freq: Three times a day (TID) | ORAL | 0 refills | Status: AC

## 2024-04-22 MED ORDER — APIXABAN 2.5 MG PO TABS
5.0000 mg | ORAL_TABLET | Freq: Two times a day (BID) | ORAL | Status: DC
  Administered 2024-04-22: 5 mg via ORAL
  Filled 2024-04-22: qty 2

## 2024-04-22 MED ORDER — CLINDAMYCIN HCL 150 MG PO CAPS
300.0000 mg | ORAL_CAPSULE | Freq: Once | ORAL | Status: AC
  Administered 2024-04-22: 300 mg via ORAL
  Filled 2024-04-22: qty 2

## 2024-04-22 MED ORDER — THERA VITAL M PO TABS: 1.0000 | ORAL_TABLET | Freq: Every day | ORAL | 0 refills | Status: AC

## 2024-04-22 NOTE — Progress Notes (Signed)
 [facility] [place] [address] [place] [address] [phone]  Theodore Frazier. 01-Nov-1965 04/22/2024   Chief Complaint  Patient presents with  . ER follow up    Was seen in [place] where Dx with chronic blood clot. Was given Eliquis . Has picked up medication and will start tomorrow. Is having slight headache today but thinks due to muscle pain. Having some sensitivity to light. Denies any floaters with vision.     HPI:  History of Present Illness The patient presents for evaluation of a blood clot in the cavernous sinus.  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 He was advised to consult a neurologist for further management. He was prescribed Eliquis  as a treatment. The condition was initially identified after he woke up with a black eye, which appeared light purple and blue and worsened by the end of the night. He experienced discomfort from lights, including headlights on his way to work. The condition seemed to improve when he was active but worsened upon lying down. A virtual visit led to a recommendation for an emergency room visit.  He has been working in high-temperature conditions, 105 to 110 degrees, with 90 percent humidity, for 12 hours a day over the past 15 days. He finds his job stressful due to the lack of breaks and the inability to consume Gatorade at work. He is considering reducing his work hours from 12 to 8 until he can assess the impact of the blood thinner. He has been able to drive and is seeking advice on whether he should continue his regular activities. He is also inquiring about the safety of continuing fish oil supplements while on Eliquis . He is requesting a note for tinted safety glasses as he has been experiencing light sensitivity.  During a maxillofacial examination, he was found to have numerous cavities and was prescribed  clindamycin  from the ED.   Current Medications[1] Allergies[2]    The following portions of the patient's history were reviewed and updated as appropriate: allergies, current medications, PFH, PMH, past social history, past surgical history and problem list.    Physical Exam  Vitals:   04/22/24 1609  BP: 118/76  BP Location: Right arm  Patient Position: Sitting  Pulse: 98  SpO2: 95%  Weight: 100 kg (221 lb 3.2 oz)  Height: 1.753 m (5' 9)    GENERAL APPEARANCE: 58 y.o., White or Caucasian,  Physical Exam    Patient is alert and oriented, reasonable.  HEENT shows bruising in the inferior left eyelid Neck is supple adenopathy thyromegaly JVD bruits Lungs clear to auscultation Heart regular rate and rhythm without murmur rub or gallop Lungs are clear to auscultation Neurologic exam is intact including cranial nerves II through XII, motor strength 5/5, reflexes 2+ bilateral equal upper and lower extremities, sensory intact to light touch.  Gait is intact.  Finger-to-nose and rapid Patipa movements are intact as well    Diagnosis:  1. Transverse sinus thrombosis (CMD)  Ambulatory referral to Neurology    2. Left eye pain  Ambulatory referral to Neurology    3. Abnormal bruising  Ambulatory referral to Neurology        Plan:  Assessment & Plan 1. Cavernous sinus thrombosis. - A scan revealed a chronic blood clot in the cavernous sinus. - He was started on Eliquis  and advised to avoid NSAIDs, decongestants, and  grapefruit juice. He was also advised to maintain adequate hydration and monitor his blood pressure regularly. - A referral to a neurologist will be made for further evaluation and management. - A note will be provided for him to remain off work until his neurology appointment.  2. Black eye. - He reported waking up with a black eye, which worsened by the end of the night. - The black eye is improving but still causes discomfort, especially with exposure  to light. - He was advised to use tinted safety glasses at work to reduce light sensitivity.  3. Dental cavities. - He was informed that the clindamycin  prescribed for his dental cavities will also cover his sinuses, throat, and lungs. - He was advised to continue taking clindamycin  as prescribed and to follow up with a dentist for further dental care.  Results Imaging  - Scan of the brain: Chronic blood clot in the inside of the venous system, specifically in the cavernous sinus.    No orders of the defined types were placed in this encounter.   Orders Placed This Encounter  Procedures  . Ambulatory referral to Neurology      No follow-ups on file.    I discussed with patient warning signs and symptoms and seeking Emergency Room care as needed.   This document serves as a record of services personally performed by Achille CANDIE Crease, MD  Electronically signed by Achille Bethena Crease, MD 04/22/2024 4:16 PM          [1] Current Outpatient Medications  Medication Sig Dispense Refill  . apixaban  (ELIQUIS ) 5 mg tab Take 5 mg by mouth 2 (two) times a day.    . clindamycin  (CLEOCIN ) 150 mg capsule Take 150 mg by mouth.    . multivitamin (THERAGRAN) tab tablet Take 1 tablet by mouth daily.    SABRA omega 3-dha-epa-fish oil (OMEGA 3) 1,000 mg capsule Take 1 g by mouth 3 (three) times a day.    . simvastatin (ZOCOR) 10 mg tablet Take 10 mg by mouth at bedtime.    . atorvastatin  (LIPITOR) 10 mg tablet Take 1 tablet (10 mg total) by mouth daily. (Patient not taking: Reported on 04/22/2024) 90 tablet 0  . benzonatate (TESSALON) 200 mg capsule Take 1 capsule (200 mg total) by mouth 3 (three) times a day as needed for cough. (Patient not taking: Reported on 04/21/2024) 60 capsule 0  . codeine-guaiFENesin (ROBITUSSIN AC) 10-100 mg/5 mL liquid Take 5 mL by mouth 3 (three) times a day as needed for cough. (Patient not taking: Reported on 04/21/2024) 237 mL 0  . meloxicam (MOBIC) 15 mg tablet  Take 1 tablet (15 mg total) by mouth daily. (Patient not taking: Reported on 04/21/2024) 30 tablet 2  . naratriptan (AMERGE) 2.5 mg tablet  (Patient not taking: Reported on 04/21/2024)    . omeprazole (PriLOSEC) 40 mg DR capsule Take 1 capsule (40 mg total) by mouth daily. (Patient not taking: Reported on 04/21/2024) 90 capsule 0   No current facility-administered medications for this visit.  [2] Allergies Allergen Reactions  . Hydrochlorothiazide Other (See Comments)    Sharp pain in chest, sweaty and clammy  . Sulfasalazine Other (See Comments)    Increased blood pressure, sweaty and clammy  . Sulfur Other (See Comments)    Clammy with numbness to left arm  . Amoxicillin Rash    Raises blood pressure

## 2024-04-22 NOTE — ED Provider Notes (Signed)
 Patient discharged with paramedic present. Medications discussed.  Patient and wife informed patient cannot take anti-inflammatories, aspirin  or goody powders.  Informed he will need to be careful shaving.  [name] had advised a work note.  This was provided.  Family informed of need for close follow up with Dr. Rosemarie of neurology.  All questions answered to patient/ wife's satisfaction    Theodore Bergeson, MD 04/22/24 [phone]

## 2024-04-22 NOTE — ED Provider Notes (Signed)
 12:24 AM Case d/w Dr. Vanessa of neurology.  Please start Eliqus 5 mg BID and have patient follow up Dr. Rosemarie of [facility] and have patient stop all NSAIDs.     Theodore Litchford, MD 04/22/24 [phone]

## 2024-04-22 NOTE — Discharge Instructions (Addendum)
 Start clindamycin, 1 tablet 3 times daily for dental caries.  Follow-up with your dentist in regards to your dental caries.  Start align probiotic, 4 times daily to prevent diarrhea secondary to antibiotic use.  Do not take ibuprofen  on these medication. Call to arrange follow up with Dr. Rosemarie of neurology

## 2024-04-30 ENCOUNTER — Encounter: Payer: Self-pay | Admitting: Neurology

## 2024-04-30 ENCOUNTER — Ambulatory Visit: Admitting: Neurology

## 2024-04-30 VITALS — BP 142/96 | HR 98 | Ht 69.0 in | Wt 221.0 lb

## 2024-04-30 DIAGNOSIS — R930 Abnormal findings on diagnostic imaging of skull and head, not elsewhere classified: Secondary | ICD-10-CM

## 2024-04-30 DIAGNOSIS — G08 Intracranial and intraspinal phlebitis and thrombophlebitis: Secondary | ICD-10-CM

## 2024-04-30 NOTE — Progress Notes (Signed)
 [facility] [address] [place]. [place] [address] [phone]       OFFICE CONSULT NOTE  [name]. Date of Birth:  December 01, 1965 Medical Record Number:  [id number]   Referring MD:  April Palumbo  Reason for Referral:  cerebral venous sinus thrombosis  HPI: Theodore Frazier is a 58 year old Caucasian male with past medical history of hypertension and hyperlipidemia.  Patient woke up on 04/20/2024 from a nap and noticed bruising beneath his left eye.  He did not report any injury or any obvious inciting event.  He was not on any blood thinners at that time.  He noticed some light sensitivity when he was driving especially with LED lights involving his left eye.  He does work in an environment under [employer] and had to wear sunglasses indoors because of light sensitive.  For the last few days that has improved.  He went to the emergency room the next day and had a CT scan of the head which showed a questionable hypodensity in the midline posteriorly extending into right transverse sinus.  CT venogram was obtained which showed a filling defect at the region raising concern for either variant anatomy or nonocclusive partial clot.  Patient was started on Eliquis  and advised to keep himself well-hydrated.  He did have history of recurrent dental infections and requiring courses of antibiotics and does work in an environment where there is a lot of heat perhaps does not keep himself well-hydrated.  He denies any significant headache, double vision, extremity weakness numbness gait or balance problems.  He has no known prior history of DVT, pulmonary embolism or clots anywhere else.  His mother did have 1 episode of DVT but is not sure if it was provoked or not.  He states about a few years ago he had a transient episode of bilateral blurred vision for 10 to 15 minutes.  He had to pull over and stop driving and the episode resolved.  He did not seek medical help at that time. ROS:    14 system review of systems is positive for light sensitivity headache, blurred vision all other systems  PMH:  Past Medical History:  Diagnosis Date   High cholesterol    Hypertension     Social History:  Social History   Socioeconomic History   Marital status: Married    Spouse name: Not on file   Number of children: Not on file   Years of education: Not on file   Highest education level: Not on file  Occupational History   Not on file  Tobacco Use   Smoking status: Never   Smokeless tobacco: Never  Substance and Sexual Activity   Alcohol use: Not Currently    Comment: occ   Drug use: No   Sexual activity: Not on file  Other Topics Concern   Not on file  Social History Narrative   Not on file   Social Drivers of Health   Financial Resource Strain: Not on file  Food Insecurity: Low Risk  (04/21/2024)   Received from [facility]   Hunger Vital Sign    Within the past 12 months, you worried that your food would run out before you got money to buy more: Never true    Within the past 12 months, the food you bought just didn't last and you didn't have money to get more. : Never true  Transportation Needs: No Transportation Needs (04/21/2024)   Received from [facility]  Transportation    In the past 12 months, has lack of reliable transportation kept you from medical appointments, meetings, work or from getting things needed for daily living? : No  Physical Activity: Not on file  Stress: Not on file  Social Connections: Unknown (03/05/2022)   Received from [facility]   Social Network    Social Network: Not on file  Intimate Partner Violence: Unknown (01/26/2022)   Received from [facility]   HITS    Physically Hurt: Not on file    Insult or Talk Down To: Not on file    Threaten Physical Harm: Not on file    Scream or Curse: Not on file    Medications:   Current Outpatient Medications on File Prior to Visit  Medication Sig Dispense Refill   apixaban   (ELIQUIS ) 5 MG TABS tablet Take 1 tablet (5 mg total) by mouth 2 (two) times daily. 60 tablet 0   clindamycin  (CLEOCIN ) 150 MG capsule Take 1 capsule (150 mg total) by mouth 3 (three) times daily. 28 capsule 0   Multiple Vitamins-Minerals (MULTIVITAMIN) tablet Take 1 tablet by mouth daily. 30 tablet 0   simvastatin (ZOCOR) 10 MG tablet Take 10 mg by mouth daily at 6 PM.     No current facility-administered medications on file prior to visit.    Allergies:   Allergies  Allergen Reactions   Amoxicillin    Hydrochlorothiazide Other (See Comments)    Sharp pain in chest, sweaty and clammy   Sulfa Antibiotics Other (See Comments)    Increased blood pressure, sweaty and clammy    Physical Exam General: well developed, well nourished, seated, in no evident distress Head: head normocephalic and atraumatic.   Neck: supple with no carotid or supraclavicular bruits Cardiovascular: regular rate and rhythm, no murmurs Musculoskeletal: no deformity Skin:  no rash small petechiae in the left infraorbital region and right temple.   Vascular:  Normal pulses all extremities  Neurologic Exam Mental Status: Awake and fully alert. Oriented to place and time. Recent and remote memory intact. Attention span, concentration and fund of knowledge appropriate. Mood and affect appropriate.  Cranial Nerves: Fundoscopic exam reveals sharp disc margins. Pupils equal, briskly reactive to light. Extraocular movements full without nystagmus. Visual fields full to confrontation. Hearing intact. Facial sensation intact. Face, tongue, palate moves normally and symmetrically.  Motor: Normal bulk and tone. Normal strength in all tested extremity muscles. Sensory.: intact to touch , pinprick , position and vibratory sensation.  Coordination: Rapid alternating movements normal in all extremities. Finger-to-nose and heel-to-shin performed accurately bilaterally. Gait and Station: Arises from chair without difficulty. Stance  is normal. Gait demonstrates normal stride length and balance . Able to heel, toe and tandem walk without difficulty.  Reflexes: 1+ and symmetric. Toes downgoing.   NIHSS  0 Modified Rankin  0   ASSESSMENT: 58 year old Caucasian male with no significant neurological complaints or deficits on exam with incidental finding of hypodensity at the junction of the right transverse sinus and superior sagittal sinus ?  Nonocclusive clot versus hypertrophied arachnoid granulation     PLAN:I had a long discussion with the patient and his wife regarding his recent CT scan of the head and CT venogram findings which suggest possible filling defect in the confluence of sinuses extending into the right transverse sinus which may represent nonocclusive clot versus giant arachnoid granulation.  I recommend further evaluation by checking MRI scan of the brain with and without contrast and MR venogram of the brain.  Check hypercoagulable panel labs, ANA.  Continue Eliquis  for now and will make a final determination after reviewing the above test.  I encouraged him to maintain adequate hydration and drink at least 8 to 10 glasses of fluids a day.  Return for follow-up in the future in 6 to 8 months or call earlier if necessary. Greater than 50% time during this 45-minute consultation visit was spent in counseling and coordination of care about his abnormal CT scan finding and need for further evaluation and answering questions. Eather Popp, MD Note: This document was prepared with digital dictation and possible smart phrase technology. Any transcriptional errors that result from this process are unintentional.

## 2024-04-30 NOTE — Patient Instructions (Signed)
 I had a long discussion with the patient and his wife regarding his recent CT scan of the head and CT venogram findings which suggest possible filling defect in the confluence of sinuses extending into the right transverse sinus which may represent nonocclusive clot versus giant arachnoid granulation.  I recommend further evaluation by checking MRI scan of the brain with and without contrast and MR venogram of the brain.  Check hypercoagulable panel labs, ANA.  Continue Eliquis  for now and will make a final determination after reviewing the above test.  I encouraged him to maintain adequate hydration and drink at least 8 to 10 glasses of fluids a day.  Return for follow-up in the future in 6 to 8 months or call earlier if necessary.

## 2024-05-05 ENCOUNTER — Telehealth: Payer: Self-pay | Admitting: Neurology

## 2024-05-05 ENCOUNTER — Encounter: Payer: Self-pay | Admitting: Neurology

## 2024-05-05 NOTE — Telephone Encounter (Signed)
 [employer] no auth required Case [id number] sent to GI [phone]

## 2024-05-06 ENCOUNTER — Ambulatory Visit: Payer: Self-pay | Admitting: Neurology

## 2024-05-11 ENCOUNTER — Ambulatory Visit (HOSPITAL_BASED_OUTPATIENT_CLINIC_OR_DEPARTMENT_OTHER)
Admission: RE | Admit: 2024-05-11 | Discharge: 2024-05-11 | Disposition: A | Discharge status: Home / Self Care | Source: Ambulatory Visit | Attending: Neurology | Admitting: Neurology

## 2024-05-11 DIAGNOSIS — G08 Intracranial and intraspinal phlebitis and thrombophlebitis: Secondary | ICD-10-CM | POA: Diagnosis present

## 2024-05-11 MED ORDER — GADOBUTROL 1 MMOL/ML IV SOLN: 10.0000 mL | Freq: Once | INTRAVENOUS | Status: DC | PRN

## 2024-05-11 MED ORDER — GADOBUTROL 1 MMOL/ML IV SOLN
10.0000 mL | Freq: Once | INTRAVENOUS | Status: AC | PRN
  Administered 2024-05-11: 10 mL via INTRAVENOUS

## 2024-05-15 DIAGNOSIS — Z0289 Encounter for other administrative examinations: Secondary | ICD-10-CM

## 2024-05-16 ENCOUNTER — Encounter: Payer: Self-pay | Admitting: Neurology

## 2024-05-20 LAB — ANA COMPREHENSIVE PANEL
Anti JO-1: <0.2 AI (ref 0.0–0.9)
Centromere Ab Screen: <0.2 AI (ref 0.0–0.9)
Chromatin Ab SerPl-aCnc: <0.2 AI (ref 0.0–0.9)
ENA RNP Ab: 0.3 AI (ref 0.0–0.9)
ENA SM Ab Ser-aCnc: <0.2 AI (ref 0.0–0.9)
ENA SSA (RO) Ab: <0.2 AI (ref 0.0–0.9)
ENA SSB (LA) Ab: 0.2 AI (ref 0.0–0.9)
Scleroderma (Scl-70) (ENA) Antibody, IgG: <0.2 AI (ref 0.0–0.9)
dsDNA Ab: <1 [IU]/mL (ref 0–9)

## 2024-05-20 LAB — HYPERCOAGULABLE PANEL, COMPREHENSIVE
APTT: 26.6 s
AT III Act/Nor PPP Chro: 143 % — ABNORMAL HIGH
Act. Prt C Resist w/FV Defic.: 2.7 ratio
Anticardiolipin Ab, IgG: <10 [GPL'U]
Anticardiolipin Ab, IgM: <10 [MPL'U]
Beta-2 Glycoprotein I, IgA: <10 SAU
Beta-2 Glycoprotein I, IgG: <10 SGU
Beta-2 Glycoprotein I, IgM: <10 SMU
DRVVT Confirm Seconds: 39.9 s
DRVVT Ratio: 1.2 ratio
DRVVT Screen Seconds: 53.8 s — ABNORMAL HIGH
Factor VII Antigen**: 123 %
Factor VIII Activity: 113 %
Hexagonal Phospholipid Neutral: 9 s
Homocysteine: 10.1 umol/L
Prot C Ag Act/Nor PPP Imm: 104 %
Prot S Ag Act/Nor PPP Imm: 131 %
Protein C Ag/FVII Ag Ratio**: 0.8 ratio
Protein S Ag/FVII Ag Ratio**: 1.1 ratio

## 2024-05-29 ENCOUNTER — Telehealth: Payer: Self-pay | Admitting: Neurology

## 2024-05-29 NOTE — Telephone Encounter (Signed)
 Per [employer] encounter, Dr Rosemarie spoke with the wife and has released the pt to return to work without restrictions starting Monday 8/18. Forms signed and placed in medical records to be sent for the pt.

## 2024-05-29 NOTE — Progress Notes (Signed)
 The results of the MRI scan and MR venogram were communicated by me to the patient's wife on 05/28/2024.  He does not have a clot in the large veins on the surface of the brain and he can discontinue Eliquis  at this time.  She voiced understanding.  He may also return to work.

## 2025-04-13 ENCOUNTER — Ambulatory Visit: Admitting: Neurology
